# Patient Record
Sex: Female | Born: 1980 | Race: White | Hispanic: No | Marital: Single | Smoking: Former smoker
Health system: Southern US, Community
[De-identification: ages and names within clinical notes are randomized; demographics above are authoritative.]

## PROBLEM LIST (undated history)

## (undated) ENCOUNTER — Inpatient Hospital Stay (HOSPITAL_COMMUNITY): Payer: Medicaid Other

## (undated) DIAGNOSIS — Z789 Other specified health status: Secondary | ICD-10-CM

## (undated) DIAGNOSIS — E0500 Thyrotoxicosis with diffuse goiter without thyrotoxic crisis or storm: Secondary | ICD-10-CM

## (undated) DIAGNOSIS — E0590 Thyrotoxicosis, unspecified without thyrotoxic crisis or storm: Secondary | ICD-10-CM

## (undated) DIAGNOSIS — O139 Gestational [pregnancy-induced] hypertension without significant proteinuria, unspecified trimester: Secondary | ICD-10-CM

## (undated) HISTORY — DX: Gestational (pregnancy-induced) hypertension without significant proteinuria, unspecified trimester: O13.9

## (undated) HISTORY — DX: Thyrotoxicosis, unspecified without thyrotoxic crisis or storm: E05.90

## (undated) HISTORY — PX: OTHER SURGICAL HISTORY: SHX169

## (undated) HISTORY — DX: Other specified health status: Z78.9

## (undated) HISTORY — DX: Thyrotoxicosis with diffuse goiter without thyrotoxic crisis or storm: E05.00

## (undated) HISTORY — PX: NO PAST SURGERIES: SHX2092

---

## 2003-10-28 ENCOUNTER — Emergency Department (HOSPITAL_COMMUNITY): Admission: EM | Admit: 2003-10-28 | Discharge: 2003-10-28 | Payer: Self-pay | Admitting: Emergency Medicine

## 2003-11-07 ENCOUNTER — Emergency Department (HOSPITAL_COMMUNITY): Admission: EM | Admit: 2003-11-07 | Discharge: 2003-11-08 | Payer: Self-pay

## 2005-03-20 ENCOUNTER — Emergency Department (HOSPITAL_COMMUNITY): Admission: EM | Admit: 2005-03-20 | Discharge: 2005-03-21 | Payer: Self-pay | Admitting: Emergency Medicine

## 2016-05-02 ENCOUNTER — Encounter: Payer: Self-pay | Admitting: Obstetrics & Gynecology

## 2016-05-02 ENCOUNTER — Ambulatory Visit (INDEPENDENT_AMBULATORY_CARE_PROVIDER_SITE_OTHER): Payer: Self-pay | Admitting: *Deleted

## 2016-05-02 DIAGNOSIS — Z3201 Encounter for pregnancy test, result positive: Secondary | ICD-10-CM

## 2016-05-02 LAB — POCT PREGNANCY, URINE: Preg Test, Ur: POSITIVE — AB

## 2016-05-02 NOTE — Progress Notes (Signed)
Pt in for pregnancy test. She has irregular cycles and isn't sure of lmp. Ultrasound for viability and dating scheduled for 05/07/16 at 1100.

## 2016-05-07 ENCOUNTER — Encounter (HOSPITAL_COMMUNITY): Payer: Self-pay

## 2016-05-07 ENCOUNTER — Ambulatory Visit (HOSPITAL_COMMUNITY)
Admission: RE | Admit: 2016-05-07 | Discharge: 2016-05-07 | Disposition: A | Discharge status: Home / Self Care | Payer: Self-pay | Source: Ambulatory Visit | Attending: Family Medicine | Admitting: Family Medicine

## 2016-05-07 DIAGNOSIS — Z3201 Encounter for pregnancy test, result positive: Secondary | ICD-10-CM

## 2016-05-07 DIAGNOSIS — O3481 Maternal care for other abnormalities of pelvic organs, first trimester: Secondary | ICD-10-CM | POA: Insufficient documentation

## 2016-05-07 DIAGNOSIS — Z36 Encounter for antenatal screening of mother: Secondary | ICD-10-CM | POA: Insufficient documentation

## 2016-05-07 DIAGNOSIS — N83201 Unspecified ovarian cyst, right side: Secondary | ICD-10-CM | POA: Insufficient documentation

## 2016-05-07 DIAGNOSIS — Z3A01 Less than 8 weeks gestation of pregnancy: Secondary | ICD-10-CM | POA: Insufficient documentation

## 2016-05-08 ENCOUNTER — Encounter: Payer: Self-pay | Admitting: Family Medicine

## 2016-05-09 ENCOUNTER — Telehealth: Payer: Self-pay | Admitting: *Deleted

## 2016-05-09 NOTE — Telephone Encounter (Signed)
Pt left message yesterday requesting test results.  

## 2016-06-05 ENCOUNTER — Ambulatory Visit (HOSPITAL_COMMUNITY)
Admission: RE | Admit: 2016-06-05 | Discharge: 2016-06-05 | Disposition: A | Discharge status: Home / Self Care | Payer: Self-pay | Source: Ambulatory Visit | Attending: Obstetrics and Gynecology | Admitting: Obstetrics and Gynecology

## 2016-06-05 ENCOUNTER — Encounter: Payer: Self-pay | Admitting: Obstetrics and Gynecology

## 2016-06-05 ENCOUNTER — Ambulatory Visit (INDEPENDENT_AMBULATORY_CARE_PROVIDER_SITE_OTHER): Payer: Self-pay | Admitting: Obstetrics and Gynecology

## 2016-06-05 VITALS — BP 124/87 | HR 107 | Ht 62.0 in | Wt 142.1 lb

## 2016-06-05 DIAGNOSIS — Z315 Encounter for genetic counseling: Secondary | ICD-10-CM | POA: Insufficient documentation

## 2016-06-05 DIAGNOSIS — Z113 Encounter for screening for infections with a predominantly sexual mode of transmission: Secondary | ICD-10-CM

## 2016-06-05 DIAGNOSIS — O09511 Supervision of elderly primigravida, first trimester: Secondary | ICD-10-CM | POA: Insufficient documentation

## 2016-06-05 DIAGNOSIS — O09519 Supervision of elderly primigravida, unspecified trimester: Secondary | ICD-10-CM

## 2016-06-05 DIAGNOSIS — Z3491 Encounter for supervision of normal pregnancy, unspecified, first trimester: Secondary | ICD-10-CM

## 2016-06-05 DIAGNOSIS — Z3481 Encounter for supervision of other normal pregnancy, first trimester: Secondary | ICD-10-CM

## 2016-06-05 DIAGNOSIS — Z3A11 11 weeks gestation of pregnancy: Secondary | ICD-10-CM | POA: Insufficient documentation

## 2016-06-05 LAB — OB RESULTS CONSOLE GC/CHLAMYDIA: Gonorrhea: NEGATIVE

## 2016-06-05 LAB — TSH: TSH: 1.52 mIU/L

## 2016-06-05 LAB — T4, FREE: Free T4: 1 ng/dL (ref 0.8–1.8)

## 2016-06-05 NOTE — Addendum Note (Signed)
Addended by: Sherre LainASH, Viola Placeres A on: 06/05/2016 11:43 AM   Modules accepted: Orders

## 2016-06-05 NOTE — Progress Notes (Signed)
New OB Note  06/05/2016   [facility]  Chief Complaint: NOB  Transfer of Care Patient: no  History of Present Illness: [name] is a 35 y.o. G1 @ 11/4 weeks (EDC 1/28, based on Patient's last menstrual period was 03/16/2016.=7wk u/s), with the above CC. Preg complicated by has AMA (advanced maternal age) primigravida 35+ and Supervision of normal pregnancy in first trimester on her problem list.   Her periods were: irregular periods She was using no method when she conceived.  She has mild signs or symptoms of nausea/vomiting of pregnancy. She has Negative signs or symptoms of miscarriage or preterm labor She identifies Negative Zika risk factors for her and her partner  ROS: A 12-point review of systems was performed and negative, except as stated in the above HPI.  OBGYN History: As per HPI. OB History  Gravida Para Term Preterm AB SAB TAB Ectopic Multiple Living  1             # Outcome Date GA Lbr Len/2nd Weight Sex Delivery Anes PTL Lv  1 Current               Any prior children are healthy, doing well, without any problems or issues: not applicable History of abnormal pap smears: No. Last pap smear 2016 per patient History of STIs: No   Past Medical History: No past medical history on file.  Past Surgical History: History reviewed. No pertinent past surgical history.  Family History:  History reviewed. No pertinent family history. She denies any female cancers, bleeding or blood clotting disorders.  She denies any history of mental retardation, birth defects or genetic disorders in her or the FOB's history  Social History:  Social History   Social History  . Marital Status: Widowed    Spouse Name: N/A  . Number of Children: N/A  . Years of Education: N/A   Occupational History  . Not on file.   Social History Main Topics  . Smoking status: Former Smoker    Types: Cigarettes  . Smokeless tobacco: Former NeurosurgeonUser  .  Alcohol Use: Not on file  . Drug Use: No  . Sexual Activity: Yes   Other Topics Concern  . Not on file   Social History Narrative  Works in Designer, industrial/productadministrative position  Allergy: No Known Allergies  Health Maintenance:  Mammogram Up to Date: not applicable  Current Outpatient Medications: PNV  Physical Exam: BP 124/87 mmHg  Pulse 107  Ht 5\' 2"  (1.575 m)  Wt 142 lb 1.6 oz (64.456 kg)  BMI 25.98 kg/m2  LMP 03/16/2016 Body mass index is 25.98 kg/(m^2). Fundal height: not applicable FHTs: 160s  General appearance: Well nourished, well developed female in no acute distress.  Neck:  Supple, normal appearance, and no thyromegaly  Cardiovascular: S1, S2 normal, no murmur, rub or gallop, regular rate and rhythm Respiratory:  Clear to auscultation bilateral. Normal respiratory effort Abdomen: positive bowel sounds and no masses, hernias; diffusely non tender to palpation, non distended Breasts: breasts appear normal, no suspicious masses, no skin or nipple changes or axillary nodes, and normal palpation. Neuro/Psych:  Normal mood and affect.  Skin:  Warm and dry.  Lymphatic:  No inguinal lymphadenopathy.   Pelvic exam: is not limited by body habitus EGBUS: within normal limits, Vagina: within normal limits and with no blood in the vault, Cervix: normal appearing cervix without discharge or lesions, closed/long/high, Uterus:  enlarged, c/w 10 week size, and Adnexa:  normal adnexa and no mass, fullness, tenderness  Laboratory: none  Imaging:  As above. SLIUP in MAU   Assessment: Normal pregnancy, AMA. Pt doing well   Plan: 1. AMA (advanced maternal age) primigravida 35+, first trimester Routine care. GC options d/w her and she is leaning towards cffdna. Able to get her an appt at 0900 today with GC. Baseline pre-x labs and TFTs today too. Patient ate breakfast so told to come back for lab only early 1hr GCT given AMA - Prenatal Profile - Culture, OB Urine - GC/Chlamydia probe  amp ([place])not at [facility] - Hemoglobinopathy Evaluation - Pain Mgmt, Profile 6 Conf w/o mM, U - Cystic fibrosis diagnostic study - Protein / Creatinine Ratio, Urine - Comprehensive metabolic panel - TSH - T4, free - AMB MFM GENETICS REFERRAL - Glucose Tolerance, 1 HR (50g) w/o Fasting; Future  2. Supervision of normal pregnancy in first trimester As above.   Problem list reviewed and updated.  Follow up in 4 weeks.  >50% of 20 min visit spent on counseling and coordination of care.     Cornelia Copa MD Attending [facility])

## 2016-06-05 NOTE — Progress Notes (Signed)
Genetic Counseling  High-Risk Gestation Note  Appointment Date:  06/05/2016 Referred By: Menard Bing, MD Date of Birth:  08-26-1981   Pregnancy History: G1P0 Estimated Date of Delivery: 12/21/16 Estimated Gestational Age: [redacted]w[redacted]d Attending: Particia Nearing, MD  [name] was seen for genetic counseling because of a maternal age of 35 y.o..     In summary:  Discussed AMA and associated risk for fetal aneuploidy  Discussed options for screening  First screen-declined  Quad screen-declined  NIPS-elected to pursue today (Panorama through [place] [facility])  Ultrasound- NT ultrasound scheduled for 06/18/16 and detailed ultrasound scheduled for 07/30/16  Discussed diagnostic testing options  CVS-declined  Amniocentesis-declined  Reviewed family history concerns  Discussed carrier screening options  CF-drawn today at primary OB visit  SMA-declined today  Hemoglobinopathies-drawn today at primary OB visit  She was counseled regarding maternal age and the association with risk for chromosome conditions due to nondisjunction with aging of the ova.   We reviewed chromosomes, nondisjunction, and the associated 1 in  29 risk for fetal aneuploidy at [redacted]w[redacted]d related to a maternal age of 35 years old at delivery.  She was counseled that the risk for aneuploidy decreases as gestational age increases, accounting for those pregnancies which spontaneously abort.  We specifically discussed Down syndrome (trisomy 56), trisomies 65 and 44, and sex chromosome aneuploidies (47,XXX and 47,XXY) including the common features and prognoses of each.   We reviewed available screening options including First Screen, Quad screen, noninvasive prenatal screening (NIPS)/cell free DNA (cfDNA) screening, and detailed ultrasound.  She was counseled that screening tests are used to modify a patient's a priori risk for aneuploidy, typically based on age. This estimate provides a pregnancy specific risk  assessment. We reviewed the benefits and limitations of each option. Specifically, we discussed the conditions for which each test screens, the detection rates, and false positive rates of each. She was also counseled regarding diagnostic testing via CVS and amniocentesis. We reviewed the approximate 1 in 300-500 risk for complications from amniocentesis, including spontaneous pregnancy loss. We discussed the possible results that the tests might provide including: positive, negative, unanticipated, and no result. Finally, they were counseled regarding the cost of each option and potential out of pocket expenses. After consideration of all the options, she elected to proceed with NIPS (Panorama through [facility]).  Those results will be available in 8-10 days.  She declined maternal serum screening, CVS, and amniocentesis.   She also expressed interest in pursuing a nuchal translucency ultrasound, which was scheduled for 06/18/16.  The patient would like to return for a detailed ultrasound at ~18+ weeks gestation.  This appointment was scheduled for 07/30/16. She understands that screening tests cannot rule out all birth defects or genetic syndromes. The patient was advised of this limitation and states she still does not want additional testing at this time.    [name]  was provided with written information regarding cystic fibrosis (CF), spinal muscular atrophy (SMA) and hemoglobinopathies including the carrier frequency, availability of carrier screening and prenatal diagnosis if indicated.  In addition, we discussed that CF and hemoglobinopathies are routinely screened for as part of the [place] newborn screening panel. CF carrier screening and hemoglobin electrophoresis were drawn today at her primary OB visit, and results are pending. After further discussion, she declined screening for SMA.  Both family histories were reviewed and found to be noncontributory for birth defects, intellectual  disability, and known genetic conditions. The patient reported that her mother had a  stillbirth, a daughter, but no known cause was reported. Without further information regarding the provided family history, an accurate genetic risk cannot be calculated. Further genetic counseling is warranted if more information is obtained.  [name] denied exposure to environmental toxins or chemical agents. She denied the use of alcohol, tobacco or street drugs. She denied significant viral illnesses during the course of her pregnancy. Her medical and surgical histories were noncontributory.   I counseled Mrs. Penny Duffy regarding the above risks and available options.  The approximate face-to-face time with the genetic counselor was 45 minutes.  Penny PlowmanKaren Bentley Haralson, MS,  Certified Genetic Counselor 06/05/2016

## 2016-06-06 LAB — PRENATAL PROFILE (SOLSTAS)
Antibody Screen: NEGATIVE
Basophils Absolute: 0 cells/uL (ref 0–200)
Basophils Relative: 0 %
Eosinophils Absolute: 108 cells/uL (ref 15–500)
Eosinophils Relative: 1 %
HCT: 35.4 % (ref 35.0–45.0)
HIV 1&2 Ab, 4th Generation: NONREACTIVE
Hemoglobin: 12 g/dL (ref 11.7–15.5)
Hepatitis B Surface Ag: NEGATIVE
Lymphocytes Relative: 13 %
Lymphs Abs: 1404 cells/uL (ref 850–3900)
MCH: 29.7 pg (ref 27.0–33.0)
MCHC: 33.9 g/dL (ref 32.0–36.0)
MCV: 87.6 fL (ref 80.0–100.0)
MPV: 11.4 fL (ref 7.5–12.5)
Monocytes Absolute: 756 cells/uL (ref 200–950)
Monocytes Relative: 7 %
Neutro Abs: 8532 cells/uL — ABNORMAL HIGH (ref 1500–7800)
Neutrophils Relative %: 79 %
Platelets: 259 10*3/uL (ref 140–400)
RBC: 4.04 MIL/uL (ref 3.80–5.10)
RDW: 13.6 % (ref 11.0–15.0)
Rh Type: POSITIVE
Rubella: 1.43 Index — ABNORMAL HIGH (ref <0.90)
WBC: 10.8 10*3/uL (ref 3.8–10.8)

## 2016-06-06 LAB — COMPREHENSIVE METABOLIC PANEL
ALT: 10 U/L (ref 6–29)
AST: 12 U/L (ref 10–30)
Albumin: 3.8 g/dL (ref 3.6–5.1)
Alkaline Phosphatase: 59 U/L (ref 33–115)
BUN: 8 mg/dL (ref 7–25)
CO2: 21 mmol/L (ref 20–31)
Calcium: 9.1 mg/dL (ref 8.6–10.2)
Chloride: 104 mmol/L (ref 98–110)
Creat: 0.54 mg/dL (ref 0.50–1.10)
Glucose, Bld: 77 mg/dL (ref 65–99)
Potassium: 4.4 mmol/L (ref 3.5–5.3)
Sodium: 136 mmol/L (ref 135–146)
Total Bilirubin: 0.2 mg/dL (ref 0.2–1.2)
Total Protein: 6.1 g/dL (ref 6.1–8.1)

## 2016-06-06 LAB — GC/CHLAMYDIA PROBE AMP (~~LOC~~) NOT AT ARMC
Chlamydia: NEGATIVE
Neisseria Gonorrhea: NEGATIVE

## 2016-06-06 NOTE — Addendum Note (Signed)
Addended by: Garret ReddishBARNES, Kiersten Coss M on: 06/06/2016 12:32 PM   Modules accepted: Orders

## 2016-06-09 LAB — HEMOGLOBINOPATHY EVALUATION
HCT: 35.4 % (ref 35.0–45.0)
Hemoglobin: 12 g/dL (ref 11.7–15.5)
Hgb A2 Quant: 2.5 % (ref 1.8–3.5)
Hgb A: 96.5 % (ref >96.0)
Hgb F Quant: <1 % (ref <2.0)
MCH: 29.7 pg (ref 27.0–33.0)
MCV: 87.6 fL (ref 80.0–100.0)
RBC: 4.04 MIL/uL (ref 3.80–5.10)
RDW: 13.6 % (ref 11.0–15.0)

## 2016-06-10 LAB — CYSTIC FIBROSIS DIAGNOSTIC STUDY

## 2016-06-11 ENCOUNTER — Other Ambulatory Visit: Payer: Self-pay

## 2016-06-12 ENCOUNTER — Telehealth (HOSPITAL_COMMUNITY): Payer: Self-pay | Admitting: MS"

## 2016-06-12 NOTE — Telephone Encounter (Signed)
Patient called to check on status of NIPS. Discussed that the lab is still processing and expected to result in next 1-2 days. Will contact patient once available.   Penny BraunKaren Syon Duffy 06/12/2016 3:31 PM

## 2016-06-13 ENCOUNTER — Telehealth (HOSPITAL_COMMUNITY): Payer: Self-pay | Admitting: MS"

## 2016-06-13 NOTE — Telephone Encounter (Signed)
Called Penny Duffy to discuss her prenatal cell free DNA test results.  Ms. Luisa Dagoracy Urizar had Panorama testing through [facility].  Testing was offered because of advanced maternal age.   The patient was identified by name and DOB.  We reviewed that these are within normal limits, showing a less than 1 in 10,000 risk for trisomies 21, 18 and 13.  In addition, the risk for triploidy and sex chromosome trisomies (47,XXX and 47,XXY) was also low risk.  We reviewed that this testing identifies > 99% of pregnancies with trisomy 4321, trisomy 2613, sex chromosome trisomies (47,XXX and 47,XXY), and triploidy. The detection rate for trisomy 18 is 96%.  The false positive rate is <0.1% for all conditions.  We discussed that analysis was not able to be performed for monosomy X (Turner syndrome). If the patient desires analysis for this condition, a second sample could be submitted. Reviewed that this condition is not included in the aneuploidy conditions associated with maternal age. Patient did not not indicate that she would desire a redraw at this time. Testing was also consistent with female fetal sex.  The patient did wish to know fetal sex.  She understands that this testing does not identify all genetic conditions.  All questions were answered to her satisfaction, she was encouraged to call with additional questions or concerns.  Quinn PlowmanKaren Krishawn Vanderweele, MS Certified Genetic Counselor 06/13/2016 5:04 PM

## 2016-06-16 ENCOUNTER — Other Ambulatory Visit (HOSPITAL_COMMUNITY): Payer: Self-pay

## 2016-06-18 ENCOUNTER — Encounter (HOSPITAL_COMMUNITY): Payer: Self-pay

## 2016-06-18 ENCOUNTER — Ambulatory Visit (HOSPITAL_COMMUNITY)
Admission: RE | Admit: 2016-06-18 | Discharge: 2016-06-18 | Disposition: A | Discharge status: Home / Self Care | Payer: Self-pay | Source: Ambulatory Visit | Attending: Obstetrics and Gynecology | Admitting: Obstetrics and Gynecology

## 2016-06-18 DIAGNOSIS — O09519 Supervision of elderly primigravida, unspecified trimester: Secondary | ICD-10-CM

## 2016-06-18 DIAGNOSIS — Z3A13 13 weeks gestation of pregnancy: Secondary | ICD-10-CM | POA: Insufficient documentation

## 2016-06-18 DIAGNOSIS — O09512 Supervision of elderly primigravida, second trimester: Secondary | ICD-10-CM | POA: Insufficient documentation

## 2016-06-18 DIAGNOSIS — Z36 Encounter for antenatal screening of mother: Secondary | ICD-10-CM | POA: Insufficient documentation

## 2016-07-02 ENCOUNTER — Ambulatory Visit (INDEPENDENT_AMBULATORY_CARE_PROVIDER_SITE_OTHER): Payer: Self-pay | Admitting: Obstetrics and Gynecology

## 2016-07-02 VITALS — BP 127/82 | HR 89 | Wt 148.4 lb

## 2016-07-02 DIAGNOSIS — Z3491 Encounter for supervision of normal pregnancy, unspecified, first trimester: Secondary | ICD-10-CM

## 2016-07-02 DIAGNOSIS — O09512 Supervision of elderly primigravida, second trimester: Secondary | ICD-10-CM

## 2016-07-02 DIAGNOSIS — Z3400 Encounter for supervision of normal first pregnancy, unspecified trimester: Secondary | ICD-10-CM | POA: Insufficient documentation

## 2016-07-02 LAB — POCT URINALYSIS DIP (DEVICE)
Bilirubin Urine: NEGATIVE
Glucose, UA: NEGATIVE mg/dL
Ketones, ur: NEGATIVE mg/dL
Leukocytes, UA: NEGATIVE
Nitrite: NEGATIVE
Protein, ur: NEGATIVE mg/dL
Specific Gravity, Urine: 1.02 (ref 1.005–1.030)
Urobilinogen, UA: 0.2 mg/dL (ref 0.0–1.0)
pH: 7.5 (ref 5.0–8.0)

## 2016-07-02 NOTE — Progress Notes (Signed)
Subjective:  Penny Duffy is a 35 y.o. G1P0 at 5178w3d being seen today for ongoing prenatal care.  She is currently monitored for the following issues for this low-risk pregnancy and has AMA (advanced maternal age) primigravida 35+ and Supervision of normal pregnancy in first trimester on her problem list.  Patient reports no complaints.  Contractions: Not present. Vag. Bleeding: None.  Movement: Present. Denies leaking of fluid. Patient was unable to complete 1 hour GTT at her last visit due to "I ate breakfast". She would like to reschedule that today.   The following portions of the patient's history were reviewed and updated as appropriate: allergies, current medications, past family history, past medical history, past social history, past surgical history and problem list. Problem list updated.  Objective:   Vitals:   07/02/16 0822  BP: 127/82  Pulse: 89  Weight: 148 lb 6.4 oz (67.3 kg)    Fetal Status: Fetal Heart Rate (bpm): 150   Movement: Present     General:  Alert, oriented and cooperative. Patient is in no acute distress.  Skin: Skin is warm and dry. No rash noted.   Cardiovascular: Normal heart rate noted  Respiratory: Normal respiratory effort, no problems with respiration noted  Abdomen: Soft, gravid, appropriate for gestational age. Pain/Pressure: Absent     Pelvic:  Cervical exam deferred        Extremities: Normal range of motion.  Edema: None  Mental Status: Normal mood and affect. Normal behavior. Normal judgment and thought content.   Urinalysis:      Results for orders placed or performed in visit on 07/02/16 (from the past 48 hour(s))  POCT urinalysis dip (device)     Status: Abnormal   Collection Time: 07/02/16  9:20 AM  Result Value Ref Range   Glucose, UA NEGATIVE NEGATIVE mg/dL   Bilirubin Urine NEGATIVE NEGATIVE   Ketones, ur NEGATIVE NEGATIVE mg/dL   Specific Gravity, Urine 1.020 1.005 - 1.030   Hgb urine dipstick TRACE (A) NEGATIVE   pH 7.5 5.0 -  8.0   Protein, ur NEGATIVE NEGATIVE mg/dL   Urobilinogen, UA 0.2 0.0 - 1.0 mg/dL   Nitrite NEGATIVE NEGATIVE   Leukocytes, UA NEGATIVE NEGATIVE    Comment: Biochemical Testing Only. Please order routine urinalysis from main lab if confirmatory testing is needed.    Assessment and Plan:  Pregnancy: G1P0 at 7578w3d  1. Supervision of normal first pregnancy, antepartum  - Urine Culture - Pain Mgmt, Profile 6 Conf w/o mM,  - Sept 6th she has her anatomy scan scheduled; keep this appointment  - NIPS testing done  - Prenatal vitamins daily.   2. AMA (advanced maternal age) primigravida 35+, second trimester  - Needs first trimester 1 hour GTT   Reviewed preterm labor precautions including reasons to go to MAU.  Return in about 4 weeks (around 07/30/2016).   Duane LopeJennifer I Neesha Langton, NP

## 2016-07-03 LAB — URINE CULTURE: Organism ID, Bacteria: 10000

## 2016-07-08 ENCOUNTER — Telehealth: Payer: Self-pay | Admitting: *Deleted

## 2016-07-08 LAB — PAIN MGMT, PROFILE 6 CONF W/O MM, U
6 Acetylmorphine: NEGATIVE ng/mL (ref <10)
Alcohol Metabolites: NEGATIVE ng/mL (ref <500)
Amphetamines: NEGATIVE ng/mL (ref <500)
Barbiturates: NEGATIVE ng/mL (ref <300)
Benzodiazepines: NEGATIVE ng/mL (ref <100)
Cocaine Metabolite: NEGATIVE ng/mL (ref <150)
Creatinine: 116.5 mg/dL (ref >=20.0)
Marijuana Metabolite: 19 ng/mL — ABNORMAL HIGH (ref <5)
Marijuana Metabolite: POSITIVE ng/mL — AB (ref <20)
Methadone Metabolite: NEGATIVE ng/mL (ref <100)
Opiates: NEGATIVE ng/mL (ref <100)
Oxidant: NEGATIVE ug/mL (ref <200)
Oxycodone: NEGATIVE ng/mL (ref <100)
Phencyclidine: NEGATIVE ng/mL (ref <25)
Please note:: 0
pH: 7.3 (ref 4.5–9.0)

## 2016-07-08 NOTE — Telephone Encounter (Signed)
Penny Duffy called yesterday afternoon and left a message that she would like to get results.

## 2016-07-09 NOTE — Telephone Encounter (Signed)
I called Penny Duffy and she states she actually got a message that results on MY Chart so she is fine.

## 2016-07-15 ENCOUNTER — Other Ambulatory Visit: Payer: Self-pay

## 2016-07-15 DIAGNOSIS — Z3492 Encounter for supervision of normal pregnancy, unspecified, second trimester: Secondary | ICD-10-CM

## 2016-07-15 LAB — GLUCOSE TOLERANCE, 1 HOUR (50G) W/O FASTING: Glucose, 1 Hr, gestational: 103 mg/dL (ref <140)

## 2016-07-30 ENCOUNTER — Other Ambulatory Visit (HOSPITAL_COMMUNITY): Payer: Self-pay | Admitting: *Deleted

## 2016-07-30 ENCOUNTER — Encounter: Payer: Self-pay | Admitting: Student

## 2016-07-30 ENCOUNTER — Ambulatory Visit (HOSPITAL_COMMUNITY)
Admission: RE | Admit: 2016-07-30 | Discharge: 2016-07-30 | Disposition: A | Discharge status: Home / Self Care | Payer: Medicaid Other | Source: Ambulatory Visit | Attending: Obstetrics and Gynecology | Admitting: Obstetrics and Gynecology

## 2016-07-30 ENCOUNTER — Encounter (HOSPITAL_COMMUNITY): Payer: Self-pay

## 2016-07-30 DIAGNOSIS — Z36 Encounter for antenatal screening of mother: Secondary | ICD-10-CM | POA: Insufficient documentation

## 2016-07-30 DIAGNOSIS — O09519 Supervision of elderly primigravida, unspecified trimester: Secondary | ICD-10-CM

## 2016-07-30 DIAGNOSIS — Z3A19 19 weeks gestation of pregnancy: Secondary | ICD-10-CM | POA: Diagnosis not present

## 2016-07-30 DIAGNOSIS — O09512 Supervision of elderly primigravida, second trimester: Secondary | ICD-10-CM | POA: Diagnosis not present

## 2016-08-13 ENCOUNTER — Encounter: Payer: Self-pay | Admitting: Advanced Practice Midwife

## 2016-08-13 ENCOUNTER — Ambulatory Visit (INDEPENDENT_AMBULATORY_CARE_PROVIDER_SITE_OTHER): Payer: Self-pay | Admitting: Advanced Practice Midwife

## 2016-08-13 DIAGNOSIS — O09512 Supervision of elderly primigravida, second trimester: Secondary | ICD-10-CM

## 2016-08-13 DIAGNOSIS — Z3491 Encounter for supervision of normal pregnancy, unspecified, first trimester: Secondary | ICD-10-CM

## 2016-08-13 NOTE — Patient Instructions (Signed)

## 2016-08-13 NOTE — Progress Notes (Signed)
   PRENATAL VISIT NOTE  Subjective:  Penny Duffy is a 35 y.o. G1P0 at 5129w3d being seen today for ongoing prenatal care.  She is currently monitored for the following issues for this high-risk pregnancy and has AMA (advanced maternal age) primigravida 35+ and Supervision of normal pregnancy in first trimester on her problem list.  Patient reports no complaints.  Contractions: Not present. Vag. Bleeding: None.  Movement: Present. Denies leaking of fluid.   The following portions of the patient's history were reviewed and updated as appropriate: allergies, current medications, past family history, past medical history, past social history, past surgical history and problem list. Problem list updated.  Objective:   Vitals:   08/13/16 1543  BP: 137/79  Pulse: 98  Weight: 162 lb 9.6 oz (73.8 kg)    Fetal Status: Fetal Heart Rate (bpm): 152   Movement: Present     General:  Alert, oriented and cooperative. Patient is in no acute distress.  Skin: Skin is warm and dry. No rash noted.   Cardiovascular: Normal heart rate noted  Respiratory: Normal respiratory effort, no problems with respiration noted  Abdomen: Soft, gravid, appropriate for gestational age. Pain/Pressure: Absent     Pelvic:  Cervical exam deferred        Extremities: Normal range of motion.  Edema: None  Mental Status: Normal mood and affect. Normal behavior. Normal judgment and thought content.   Urinalysis:      Assessment and Plan:  Pregnancy: G1P0 at 2829w3d  1. Supervision of normal pregnancy in first trimester     Discussed normal progression of fetal movement, back pain suggestions, warning signs  2. AMA (advanced maternal age) primigravida 7135+, second trimester      US normal.  Followup growth US already scheduled for October and November  Preterm labor symptoms and general obstetric precautions including but not limited to vaginal bleeding, contractions, leaking of fluid and fetal movement were reviewed in  detail with the patient. Please refer to After Visit Summary for other counseling recommendations.   Return in about 4 weeks (around 09/10/2016) for Low Risk Clinic.  Aviva SignsMarie L Mickayla Trouten, CNM

## 2016-09-10 ENCOUNTER — Encounter (HOSPITAL_COMMUNITY): Payer: Self-pay

## 2016-09-10 ENCOUNTER — Ambulatory Visit (HOSPITAL_COMMUNITY)
Admission: RE | Admit: 2016-09-10 | Discharge: 2016-09-10 | Disposition: A | Discharge status: Home / Self Care | Payer: Medicaid Other | Source: Ambulatory Visit | Attending: Obstetrics and Gynecology | Admitting: Obstetrics and Gynecology

## 2016-09-10 DIAGNOSIS — O09519 Supervision of elderly primigravida, unspecified trimester: Secondary | ICD-10-CM

## 2016-09-10 DIAGNOSIS — Z3A25 25 weeks gestation of pregnancy: Secondary | ICD-10-CM | POA: Insufficient documentation

## 2016-09-10 DIAGNOSIS — O09512 Supervision of elderly primigravida, second trimester: Secondary | ICD-10-CM | POA: Diagnosis not present

## 2016-09-22 ENCOUNTER — Ambulatory Visit (INDEPENDENT_AMBULATORY_CARE_PROVIDER_SITE_OTHER): Payer: Medicaid Other | Admitting: Student

## 2016-09-22 ENCOUNTER — Encounter: Payer: Self-pay | Admitting: Family Medicine

## 2016-09-22 VITALS — BP 132/83 | HR 89 | Wt 170.0 lb

## 2016-09-22 DIAGNOSIS — O09512 Supervision of elderly primigravida, second trimester: Secondary | ICD-10-CM

## 2016-09-22 DIAGNOSIS — Z3401 Encounter for supervision of normal first pregnancy, first trimester: Secondary | ICD-10-CM

## 2016-09-22 LAB — CBC
HCT: 37.6 % (ref 35.0–45.0)
Hemoglobin: 12.7 g/dL (ref 11.7–15.5)
MCH: 30 pg (ref 27.0–33.0)
MCHC: 33.8 g/dL (ref 32.0–36.0)
MCV: 88.9 fL (ref 80.0–100.0)
MPV: 11 fL (ref 7.5–12.5)
Platelets: 197 10*3/uL (ref 140–400)
RBC: 4.23 MIL/uL (ref 3.80–5.10)
RDW: 13.6 % (ref 11.0–15.0)
WBC: 12.8 10*3/uL — ABNORMAL HIGH (ref 3.8–10.8)

## 2016-09-22 NOTE — Patient Instructions (Signed)

## 2016-09-22 NOTE — Progress Notes (Signed)
   PRENATAL VISIT NOTE  Subjective:  Penny Duffy is a 35 y.o. G1P0 at 223w1d being seen today for ongoing prenatal care.  She is currently monitored for the following issues for this high-risk pregnancy and has AMA (advanced maternal age) primigravida 35+ and Supervision of normal pregnancy in first trimester on her problem list.  Patient reports no complaints.  Contractions: Not present. Vag. Bleeding: None.  Movement: Present. Denies leaking of fluid.   The following portions of the patient's history were reviewed and updated as appropriate: allergies, current medications, past family history, past medical history, past social history, past surgical history and problem list. Problem list updated.  Objective:   Vitals:   09/22/16 0809  BP: 132/83  Pulse: 89  Weight: 170 lb (77.1 kg)    Fetal Status: Fetal Heart Rate (bpm): 147   Movement: Present   Fundal height 25 cm  General:  Alert, oriented and cooperative. Patient is in no acute distress.  Skin: Skin is warm and dry. No rash noted.   Cardiovascular: Normal heart rate noted  Respiratory: Normal respiratory effort, no problems with respiration noted  Abdomen: Soft, gravid, appropriate for gestational age. Pain/Pressure: Absent     Pelvic:  Cervical exam deferred        Extremities: Normal range of motion.  Edema: Trace  Mental Status: Normal mood and affect. Normal behavior. Normal judgment and thought content.   Assessment and Plan:  Pregnancy: G1P0 at 3223w1d  1. Encounter for supervision of normal first pregnancy  - CBC - RPR - HIV antibody (with reflex) - Glucose Tolerance, 1 HR (50g) w/o Fasting  2. Elderly primigravida in second trimester  - CBC - RPR - HIV antibody (with reflex) - Glucose Tolerance, 1 HR (50g) w/o Fasting  Preterm labor symptoms and general obstetric precautions including but not limited to vaginal bleeding, contractions, leaking of fluid and fetal movement were reviewed in detail with the  patient. Please refer to After Visit Summary for other counseling recommendations.  Return in about 2 weeks (around 10/06/2016) for Routine OB.  Judeth HornErin Tyson Masin, NP

## 2016-09-23 LAB — GLUCOSE TOLERANCE, 1 HOUR (50G) W/O FASTING: Glucose, 1 Hr, gestational: 161 mg/dL — ABNORMAL HIGH (ref <140)

## 2016-09-23 LAB — HIV ANTIBODY (ROUTINE TESTING W REFLEX): HIV 1&2 Ab, 4th Generation: NONREACTIVE

## 2016-09-23 LAB — RPR

## 2016-09-25 ENCOUNTER — Encounter: Payer: Medicaid Other | Admitting: Family Medicine

## 2016-09-30 ENCOUNTER — Other Ambulatory Visit: Payer: Self-pay

## 2016-09-30 DIAGNOSIS — O24419 Gestational diabetes mellitus in pregnancy, unspecified control: Secondary | ICD-10-CM

## 2016-10-01 ENCOUNTER — Telehealth: Payer: Self-pay

## 2016-10-01 LAB — GLUCOSE TOLERANCE, 3 HOURS
Glucose Tolerance, 1 hour: 167 mg/dL (ref <190)
Glucose Tolerance, 2 hour: 207 mg/dL — ABNORMAL HIGH (ref <165)
Glucose Tolerance, Fasting: 82 mg/dL (ref 65–104)
Glucose, GTT - 3 Hour: 125 mg/dL (ref <145)

## 2016-10-01 NOTE — Telephone Encounter (Signed)
Per Penny HornErin Duffy, pt needs 3 hr.  Contacted pt about the need for f/u 3 hr glucose.  Pt informed me that she came in already and had the 3 hr glucose lab on 09/30/16.  Looking at pt's chart, per standing protocol, I informed pt that only one value resulted abnormal but she does not have gestational diabetes.  Pt stated thank you and had no further questions.

## 2016-10-06 ENCOUNTER — Encounter: Payer: Self-pay | Admitting: Student

## 2016-10-07 ENCOUNTER — Ambulatory Visit (INDEPENDENT_AMBULATORY_CARE_PROVIDER_SITE_OTHER): Payer: Self-pay | Admitting: Advanced Practice Midwife

## 2016-10-07 ENCOUNTER — Encounter: Payer: Self-pay | Admitting: Family Medicine

## 2016-10-07 VITALS — BP 130/80 | HR 84 | Wt 174.8 lb

## 2016-10-07 DIAGNOSIS — O09512 Supervision of elderly primigravida, second trimester: Secondary | ICD-10-CM

## 2016-10-07 DIAGNOSIS — Z3401 Encounter for supervision of normal first pregnancy, first trimester: Secondary | ICD-10-CM

## 2016-10-07 NOTE — Patient Instructions (Signed)
Third Trimester of Pregnancy The third trimester is from week 29 through week 40 (months 7 through 9). The third trimester is a time when the unborn baby (fetus) is growing rapidly. At the end of the ninth month, the fetus is about 20 inches in length and weighs 6-10 pounds. Body changes during your third trimester Your body goes through many changes during pregnancy. The changes vary from woman to woman. During the third trimester:  Your weight will continue to increase. You can expect to gain 25-35 pounds (11-16 kg) by the end of the pregnancy.  You may begin to get stretch marks on your hips, abdomen, and breasts.  You may urinate more often because the fetus is moving lower into your pelvis and pressing on your bladder.  You may develop or continue to have heartburn. This is caused by increased hormones that slow down muscles in the digestive tract.  You may develop or continue to have constipation because increased hormones slow digestion and cause the muscles that push waste through your intestines to relax.  You may develop hemorrhoids. These are swollen veins (varicose veins) in the rectum that can itch or be painful.  You may develop swollen, bulging veins (varicose veins) in your legs.  You may have increased body aches in the pelvis, back, or thighs. This is due to weight gain and increased hormones that are relaxing your joints.  You may have changes in your hair. These can include thickening of your hair, rapid growth, and changes in texture. Some women also have hair loss during or after pregnancy, or hair that feels dry or thin. Your hair will most likely return to normal after your baby is born.  Your breasts will continue to grow and they will continue to become tender. A yellow fluid (colostrum) may leak from your breasts. This is the first milk you are producing for your baby.  Your belly button may stick out.  You may notice more swelling in your hands, face, or  ankles.  You may have increased tingling or numbness in your hands, arms, and legs. The skin on your belly may also feel numb.  You may feel short of breath because of your expanding uterus.  You may have more problems sleeping. This can be caused by the size of your belly, increased need to urinate, and an increase in your body's metabolism.  You may notice the fetus "dropping," or moving lower in your abdomen.  You may have increased vaginal discharge.  Your cervix becomes thin and soft (effaced) near your due date. What to expect at prenatal visits You will have prenatal exams every 2 weeks until week 36. Then you will have weekly prenatal exams. During a routine prenatal visit:  You will be weighed to make sure you and the fetus are growing normally.  Your blood pressure will be taken.  Your abdomen will be measured to track your baby's growth.  The fetal heartbeat will be listened to.  Any test results from the previous visit will be discussed.  You may have a cervical check near your due date to see if you have effaced. At around 36 weeks, your health care provider will check your cervix. At the same time, your health care provider will also perform a test on the secretions of the vaginal tissue. This test is to determine if a type of bacteria, Group B streptococcus, is present. Your health care provider will explain this further. Your health care provider may ask you:    What your birth plan is.  How you are feeling.  If you are feeling the baby move.  If you have had any abnormal symptoms, such as leaking fluid, bleeding, severe headaches, or abdominal cramping.  If you are using any tobacco products, including cigarettes, chewing tobacco, and electronic cigarettes.  If you have any questions. Other tests or screenings that may be performed during your third trimester include:  Blood tests that check for low iron levels (anemia).  Fetal testing to check the health,  activity level, and growth of the fetus. Testing is done if you have certain medical conditions or if there are problems during the pregnancy.  Nonstress test (NST). This test checks the health of your baby to make sure there are no signs of problems, such as the baby not getting enough oxygen. During this test, a belt is placed around your belly. The baby is made to move, and its heart rate is monitored during movement. What is false labor? False labor is a condition in which you feel small, irregular tightenings of the muscles in the womb (contractions) that eventually go away. These are called Braxton Hicks contractions. Contractions may last for hours, days, or even weeks before true labor sets in. If contractions come at regular intervals, become more frequent, increase in intensity, or become painful, you should see your health care provider. What are the signs of labor?  Abdominal cramps.  Regular contractions that start at 10 minutes apart and become stronger and more frequent with time.  Contractions that start on the top of the uterus and spread down to the lower abdomen and back.  Increased pelvic pressure and dull back pain.  A watery or bloody mucus discharge that comes from the vagina.  Leaking of amniotic fluid. This is also known as your "water breaking." It could be a slow trickle or a gush. Let your doctor know if it has a color or strange odor. If you have any of these signs, call your health care provider right away, even if it is before your due date. Follow these instructions at home: Eating and drinking  Continue to eat regular, healthy meals.  Do not eat:  Raw meat or meat spreads.  Unpasteurized milk or cheese.  Unpasteurized juice.  Store-made salad.  Refrigerated smoked seafood.  Hot dogs or deli meat, unless they are piping hot.  More than 6 ounces of albacore tuna a week.  Shark, swordfish, king mackerel, or tile fish.  Store-made salads.  Raw  sprouts, such as mung bean or alfalfa sprouts.  Take prenatal vitamins as told by your health care provider.  Take 1000 mg of calcium daily as told by your health care provider.  If you develop constipation:  Take over-the-counter or prescription medicines.  Drink enough fluid to keep your urine clear or pale yellow.  Eat foods that are high in fiber, such as fresh fruits and vegetables, whole grains, and beans.  Limit foods that are high in fat and processed sugars, such as fried and sweet foods. Activity  Exercise only as directed by your health care provider. Healthy pregnant women should aim for 2 hours and 30 minutes of moderate exercise per week. If you experience any pain or discomfort while exercising, stop.  Avoid heavy lifting.  Do not exercise in extreme heat or humidity, or at high altitudes.  Wear low-heel, comfortable shoes.  Practice good posture.  Do not travel far distances unless it is absolutely necessary and only with the approval   of your health care provider.  Wear your seat belt at all times while in a car, on a bus, or on a plane.  Take frequent breaks and rest with your legs elevated if you have leg cramps or low back pain.  Do not use hot tubs, steam rooms, or saunas.  You may continue to have sex unless your health care provider tells you otherwise. Lifestyle  Do not use any products that contain nicotine or tobacco, such as cigarettes and e-cigarettes. If you need help quitting, ask your health care provider.  Do not drink alcohol.  Do not use any medicinal herbs or unprescribed drugs. These chemicals affect the formation and growth of the baby.  If you develop varicose veins:  Wear support pantyhose or compression stockings as told by your healthcare provider.  Elevate your feet for 15 minutes, 3-4 times a day.  Wear a supportive maternity bra to help with breast tenderness. General instructions  Take over-the-counter and prescription  medicines only as told by your health care provider. There are medicines that are either safe or unsafe to take during pregnancy.  Take warm sitz baths to soothe any pain or discomfort caused by hemorrhoids. Use hemorrhoid cream or witch hazel if your health care provider approves.  Avoid cat litter boxes and soil used by cats. These carry germs that can cause birth defects in the baby. If you have a cat, ask someone to clean the litter box for you.  To prepare for the arrival of your baby:  Take prenatal classes to understand, practice, and ask questions about the labor and delivery.  Make a trial run to the hospital.  Visit the hospital and tour the maternity area.  Arrange for maternity or paternity leave through employers.  Arrange for family and friends to take care of pets while you are in the hospital.  Purchase a rear-facing car seat and make sure you know how to install it in your car.  Pack your hospital bag.  Prepare the baby's nursery. Make sure to remove all pillows and stuffed animals from the baby's crib to prevent suffocation.  Visit your dentist if you have not gone during your pregnancy. Use a soft toothbrush to brush your teeth and be gentle when you floss.  Keep all prenatal follow-up visits as told by your health care provider. This is important. Contact a health care provider if:  You are unsure if you are in labor or if your water has broken.  You become dizzy.  You have mild pelvic cramps, pelvic pressure, or nagging pain in your abdominal area.  You have lower back pain.  You have persistent nausea, vomiting, or diarrhea.  You have an unusual or bad smelling vaginal discharge.  You have pain when you urinate. Get help right away if:  You have a fever.  You are leaking fluid from your vagina.  You have spotting or bleeding from your vagina.  You have severe abdominal pain or cramping.  You have rapid weight loss or weight gain.  You have  shortness of breath with chest pain.  You notice sudden or extreme swelling of your face, hands, ankles, feet, or legs.  Your baby makes fewer than 10 movements in 2 hours.  You have severe headaches that do not go away with medicine.  You have vision changes. Summary  The third trimester is from week 29 through week 40, months 7 through 9. The third trimester is a time when the unborn baby (fetus)   is growing rapidly.  During the third trimester, your discomfort may increase as you and your baby continue to gain weight. You may have abdominal, leg, and back pain, sleeping problems, and an increased need to urinate.  During the third trimester your breasts will keep growing and they will continue to become tender. A yellow fluid (colostrum) may leak from your breasts. This is the first milk you are producing for your baby.  False labor is a condition in which you feel small, irregular tightenings of the muscles in the womb (contractions) that eventually go away. These are called Braxton Hicks contractions. Contractions may last for hours, days, or even weeks before true labor sets in.  Signs of labor can include: abdominal cramps; regular contractions that start at 10 minutes apart and become stronger and more frequent with time; watery or bloody mucus discharge that comes from the vagina; increased pelvic pressure and dull back pain; and leaking of amniotic fluid. This information is not intended to replace advice given to you by your health care provider. Make sure you discuss any questions you have with your health care provider. Document Released: 11/04/2001 Document Revised: 04/17/2016 Document Reviewed: 01/11/2013 Elsevier Interactive Patient Education  2017 [employer]  

## 2016-10-09 ENCOUNTER — Telehealth: Payer: Self-pay

## 2016-10-09 NOTE — Telephone Encounter (Signed)
Contacted pt to verify if she wanted her FMLA paperwork to be filled out for intermittent or for when she delivers.  Pt stated that she wanted the paperwork to be filled out for when she delivers.  I explained to the patient that it will be filled out from her due date to approximately 6-8 weeks depending on what type of delivery and that the paperwork can be changed.  Pt stated understanding with no further questions.

## 2016-10-09 NOTE — Progress Notes (Signed)
   PRENATAL VISIT NOTE  Subjective:  Penny Duffy is a 35 y.o. G1P0 at 2954w4d being seen today for ongoing prenatal care.  She is currently monitored for the following issues for this low-risk pregnancy and has AMA (advanced maternal age) primigravida 35+ and Supervision of normal pregnancy in first trimester on her problem list.  Patient reports no complaints.  Contractions: Not present. Vag. Bleeding: None.  Movement: Present. Denies leaking of fluid.   The following portions of the patient's history were reviewed and updated as appropriate: allergies, current medications, past family history, past medical history, past social history, past surgical history and problem list. Problem list updated.  Objective:   Vitals:   10/07/16 0823  BP: 130/80  Pulse: 84  Weight: 174 lb 12.8 oz (79.3 kg)    Fetal Status: Fetal Heart Rate (bpm): 150 Fundal Height: 29 cm Movement: Present     General:  Alert, oriented and cooperative. Patient is in no acute distress.  Skin: Skin is warm and dry. No rash noted.   Cardiovascular: Normal heart rate noted  Respiratory: Normal respiratory effort, no problems with respiration noted  Abdomen: Soft, gravid, appropriate for gestational age. Pain/Pressure: Absent     Pelvic:  Cervical exam deferred        Extremities: Normal range of motion.  Edema: None  Mental Status: Normal mood and affect. Normal behavior. Normal judgment and thought content.   Assessment and Plan:  Pregnancy: G1P0 at 1354w4d  1. Encounter for supervision of normal first pregnancy   2. AMA (advanced maternal age) primigravida 6735+, second trimester   Preterm labor symptoms and general obstetric precautions including but not limited to vaginal bleeding, contractions, leaking of fluid and fetal movement were reviewed in detail with the patient. Please refer to After Visit Summary for other counseling recommendations.  Return in about 2 weeks (around 10/21/2016).   Hurshel PartyLisa A  Leftwich-Kirby, CNM

## 2016-10-21 ENCOUNTER — Ambulatory Visit (INDEPENDENT_AMBULATORY_CARE_PROVIDER_SITE_OTHER): Payer: Medicaid Other | Admitting: Student

## 2016-10-21 VITALS — BP 130/80 | HR 84 | Wt 176.9 lb

## 2016-10-21 DIAGNOSIS — Z3403 Encounter for supervision of normal first pregnancy, third trimester: Secondary | ICD-10-CM

## 2016-10-21 LAB — POCT URINALYSIS DIP (DEVICE)
Bilirubin Urine: NEGATIVE
Glucose, UA: 500 mg/dL — AB
Ketones, ur: NEGATIVE mg/dL
Leukocytes, UA: NEGATIVE
Nitrite: NEGATIVE
Protein, ur: NEGATIVE mg/dL
Specific Gravity, Urine: 1.02 (ref 1.005–1.030)
Urobilinogen, UA: 0.2 mg/dL (ref 0.0–1.0)
pH: 7 (ref 5.0–8.0)

## 2016-10-21 NOTE — Patient Instructions (Signed)
How a Baby Grows During Pregnancy Introduction Pregnancy begins when a female's sperm enters a female's egg (fertilization). This happens in one of the tubes (fallopian tubes) that connect the ovaries to the womb (uterus). The fertilized egg is called an embryo until it reaches 10 weeks. From 10 weeks until birth, it is called a fetus. The fertilized egg moves down the fallopian tube to the uterus. Then it implants into the lining of the uterus and begins to grow. The developing fetus receives oxygen and nutrients through the pregnant woman's bloodstream and the tissues that grow (placenta) to support the fetus. The placenta is the life support system for the fetus. It provides nutrition and removes waste. Learning as much as you can about your pregnancy and how your baby is developing can help you enjoy the experience. It can also make you aware of when there might be a problem and when to ask questions. How long does a typical pregnancy last? A pregnancy usually lasts 280 days, or about 40 weeks. Pregnancy is divided into three trimesters:  First trimester: 0-13 weeks.  Second trimester: 14-27 weeks.  Third trimester: 28-40 weeks. The day when your baby is considered ready to be born (full term) is your estimated date of delivery. How does my baby develop month by month? First month  The fertilized egg attaches to the inside of the uterus.  Some cells will form the placenta. Others will form the fetus.  The arms, legs, brain, spinal cord, lungs, and heart begin to develop.  At the end of the first month, the heart begins to beat. Second month  The bones, inner ear, eyelids, hands, and feet form.  The genitals develop.  By the end of 8 weeks, all major organs are developing. Third month  All of the internal organs are forming.  Teeth develop below the gums.  Bones and muscles begin to grow. The spine can flex.  The skin is transparent.  Fingernails and toenails begin to  form.  Arms and legs continue to grow longer, and hands and feet develop.  The fetus is about 3 in (7.6 cm) long. Fourth month  The placenta is completely formed.  The external sex organs, neck, outer ear, eyebrows, eyelids, and fingernails are formed.  The fetus can hear, swallow, and move its arms and legs.  The kidneys begin to produce urine.  The skin is covered with a white waxy coating (vernix) and very fine hair (lanugo). Fifth month  The fetus moves around more and can be felt for the first time (quickening).  The fetus starts to sleep and wake up and may begin to suck its finger.  The nails grow to the end of the fingers.  The organ in the digestive system that makes bile (gallbladder) functions and helps to digest the nutrients.  If your baby is a girl, eggs are present in her ovaries. If your baby is a boy, testicles start to move down into his scrotum. Sixth month  The lungs are formed, but the fetus is not yet able to breathe.  The eyes open. The brain continues to develop.  Your baby has fingerprints and toe prints. Your baby's hair grows thicker.  At the end of the second trimester, the fetus is about 9 in (22.9 cm) long. Seventh month  The fetus kicks and stretches.  The eyes are developed enough to sense changes in light.  The hands can make a grasping motion.  The fetus responds to sound. Eighth month    All organs and body systems are fully developed and functioning.  Bones harden and taste buds develop. The fetus may hiccup.  Certain areas of the brain are still developing. The skull remains soft. Ninth month  The fetus gains about  lb (0.23 kg) each week.  The lungs are fully developed.  Patterns of sleep develop.  The fetus's head typically moves into a head-down position (vertex) in the uterus to prepare for birth. If the buttocks move into a vertex position instead, the baby is breech.  The fetus weighs 6-9 lbs (2.72-4.08 kg) and is  19-20 in (48.26-50.8 cm) long. What can I do to have a healthy pregnancy and help my baby develop?  Eating and Drinking  Eat a healthy diet.  Talk with your health care provider to make sure that you are getting the nutrients that you and your baby need.  Visit www.choosemyplate.gov to learn about creating a healthy diet.  Gain a healthy amount of weight during pregnancy as advised by your health care provider. This is usually 25-35 pounds. You may need to:  Gain more if you were underweight before getting pregnant or if you are pregnant with more than one baby.  Gain less if you were overweight or obese when you got pregnant. Medicines and Vitamins  Take prenatal vitamins as directed by your health care provider. These include vitamins such as folic acid, iron, calcium, and vitamin D. They are important for healthy development.  Take medicines only as directed by your health care provider. Read labels and ask a pharmacist or your health care provider whether over-the-counter medicines, supplements, and prescription drugs are safe to take during pregnancy. Activities  Be physically active as advised by your health care provider. Ask your health care provider to recommend activities that are safe for you to do, such as walking or swimming.  Do not participate in strenuous or extreme sports.  Lifestyle  Do not drink alcohol.  Do not use any tobacco products, including cigarettes, chewing tobacco, or electronic cigarettes. If you need help quitting, ask your health care provider.  Do not use illegal drugs. Safety  Avoid exposure to mercury, lead, or other heavy metals. Ask your health care provider about common sources of these heavy metals.  Avoid listeria infection during pregnancy. Follow these precautions:  Do not eat soft cheeses or deli meats.  Do not eat hot dogs unless they have been warmed up to the point of steaming, such as in the microwave oven.  Do not drink  unpasteurized milk.  Avoid toxoplasmosis infection during pregnancy. Follow these precautions:  Do not change your cat's litter box, if you have a cat. Ask someone else to do this for you.  Wear gardening gloves while working in the yard. General Instructions  Keep all follow-up visits as directed by your health care provider. This is important. This includes prenatal care and screening tests.  Manage any chronic health conditions. Work closely with your health care provider to keep conditions, such as diabetes, under control. How do I know if my baby is developing well? At each prenatal visit, your health care provider will do several different tests to check on your health and keep track of your baby's development. These include:  Fundal height.  Your health care provider will measure your growing belly from top to bottom using a tape measure.  Your health care provider will also feel your belly to determine your baby's position.  Heartbeat.  An ultrasound in the first trimester   can confirm pregnancy and show a heartbeat, depending on how far along you are.  Your health care provider will check your baby's heart rate at every prenatal visit.  As you get closer to your delivery date, you may have regular fetal heart rate monitoring to make sure that your baby is not in distress.  Second trimester ultrasound.  This ultrasound checks your baby's development. It also indicates your baby's gender. What should I do if I have concerns about my baby's development? Always talk with your health care provider about any concerns that you may have. This information is not intended to replace advice given to you by your health care provider. Make sure you discuss any questions you have with your health care provider. Document Released: 04/28/2008 Document Revised: 04/17/2016 Document Reviewed: 04/19/2014  2017 Elsevier  

## 2016-10-21 NOTE — Progress Notes (Signed)
ob

## 2016-10-21 NOTE — Progress Notes (Signed)
   PRENATAL VISIT NOTE  Subjective:  Penny Duffy is a 35 y.o. G1P0 at 5271w2d being seen today for ongoing prenatal care.  She is currently monitored for the following issues for this low-risk pregnancy and has AMA (advanced maternal age) primigravida 35+ and Supervision of normal pregnancy in first trimester on her problem list.  Patient reports no complaints.  Contractions: Not present. Vag. Bleeding: None.  Movement: Present. Denies leaking of fluid.   The following portions of the patient's history were reviewed and updated as appropriate: allergies, current medications, past family history, past medical history, past social history, past surgical history and problem list. Problem list updated.  Objective:   Vitals:   10/21/16 0802  BP: 130/80  Pulse: 84  Weight: 80.2 kg (176 lb 14.4 oz)    Fetal Status: Fetal Heart Rate (bpm): 148 Fundal Height: 31 cm Movement: Present     General:  Alert, oriented and cooperative. Patient is in no acute distress.  Skin: Skin is warm and dry. No rash noted.   Cardiovascular: Normal heart rate noted  Respiratory: Normal respiratory effort, no problems with respiration noted  Abdomen: Soft, gravid, appropriate for gestational age. Pain/Pressure: Absent     Pelvic:  Cervical exam deferred        Extremities: Normal range of motion.  Edema: Trace  Mental Status: Normal mood and affect. Normal behavior. Normal judgment and thought content.   Assessment and Plan:  Pregnancy: G1P0 at 671w2d  There are no diagnoses linked to this encounter.  Patient Penny Duffy doing well; feeling baby move.  UDS done today. Patient declines Flu shot today but will get next visit.    Preterm labor symptoms and general obstetric precautions including but not limited to vaginal bleeding, contractions, leaking of fluid and fetal movement were reviewed in detail with the patient. Please refer to After Visit Summary for other counseling recommendations.  Return  in about 2 weeks (around 11/04/2016).   Marylene LandKathryn Lorraine Kooistra, CNM

## 2016-10-22 ENCOUNTER — Encounter (HOSPITAL_COMMUNITY): Payer: Self-pay

## 2016-10-22 ENCOUNTER — Ambulatory Visit (HOSPITAL_COMMUNITY)
Admission: RE | Admit: 2016-10-22 | Discharge: 2016-10-22 | Disposition: A | Discharge status: Home / Self Care | Payer: Medicaid Other | Source: Ambulatory Visit | Attending: Obstetrics and Gynecology | Admitting: Obstetrics and Gynecology

## 2016-10-22 DIAGNOSIS — O09513 Supervision of elderly primigravida, third trimester: Secondary | ICD-10-CM | POA: Diagnosis present

## 2016-10-22 DIAGNOSIS — O09519 Supervision of elderly primigravida, unspecified trimester: Secondary | ICD-10-CM

## 2016-10-22 DIAGNOSIS — Z3A31 31 weeks gestation of pregnancy: Secondary | ICD-10-CM | POA: Insufficient documentation

## 2016-10-22 LAB — PAIN MGMT, PROFILE 6 CONF W/O MM, U
6 Acetylmorphine: NEGATIVE ng/mL (ref <10)
Alcohol Metabolites: NEGATIVE ng/mL (ref <500)
Amphetamines: NEGATIVE ng/mL (ref <500)
Barbiturates: NEGATIVE ng/mL (ref <300)
Benzodiazepines: NEGATIVE ng/mL (ref <100)
Cocaine Metabolite: NEGATIVE ng/mL (ref <150)
Creatinine: 78.1 mg/dL (ref >=20.0)
Marijuana Metabolite: NEGATIVE ng/mL (ref <20)
Methadone Metabolite: NEGATIVE ng/mL (ref <100)
Opiates: NEGATIVE ng/mL (ref <100)
Oxidant: NEGATIVE ug/mL (ref <200)
Oxycodone: NEGATIVE ng/mL (ref <100)
Phencyclidine: NEGATIVE ng/mL (ref <25)
Please note:: 0
pH: 7.14 (ref 4.5–9.0)

## 2016-11-05 ENCOUNTER — Ambulatory Visit (INDEPENDENT_AMBULATORY_CARE_PROVIDER_SITE_OTHER): Payer: Self-pay | Admitting: Medical

## 2016-11-05 VITALS — BP 126/85 | HR 108 | Wt 182.6 lb

## 2016-11-05 DIAGNOSIS — O163 Unspecified maternal hypertension, third trimester: Secondary | ICD-10-CM

## 2016-11-05 DIAGNOSIS — O133 Gestational [pregnancy-induced] hypertension without significant proteinuria, third trimester: Secondary | ICD-10-CM

## 2016-11-05 LAB — POCT URINALYSIS DIP (DEVICE)
BILIRUBIN URINE: NEGATIVE
GLUCOSE, UA: NEGATIVE mg/dL
Ketones, ur: NEGATIVE mg/dL
LEUKOCYTES UA: NEGATIVE
NITRITE: NEGATIVE
Protein, ur: NEGATIVE mg/dL
Specific Gravity, Urine: 1.02 (ref 1.005–1.030)
UROBILINOGEN UA: 0.2 mg/dL (ref 0.0–1.0)
pH: 7 (ref 5.0–8.0)

## 2016-11-05 LAB — COMPREHENSIVE METABOLIC PANEL
ALBUMIN: 3.2 g/dL — AB (ref 3.6–5.1)
ALK PHOS: 120 U/L — AB (ref 33–115)
ALT: 26 U/L (ref 6–29)
AST: 21 U/L (ref 10–30)
BILIRUBIN TOTAL: 0.3 mg/dL (ref 0.2–1.2)
BUN: 8 mg/dL (ref 7–25)
CALCIUM: 8.7 mg/dL (ref 8.6–10.2)
CO2: 20 mmol/L (ref 20–31)
CREATININE: 0.5 mg/dL (ref 0.50–1.10)
Chloride: 105 mmol/L (ref 98–110)
Glucose, Bld: 121 mg/dL — ABNORMAL HIGH (ref 65–99)
Potassium: 3.8 mmol/L (ref 3.5–5.3)
Sodium: 136 mmol/L (ref 135–146)
TOTAL PROTEIN: 5.9 g/dL — AB (ref 6.1–8.1)

## 2016-11-05 LAB — CBC
HEMATOCRIT: 39.3 % (ref 35.0–45.0)
HEMOGLOBIN: 13.3 g/dL (ref 11.7–15.5)
MCH: 29.9 pg (ref 27.0–33.0)
MCHC: 33.8 g/dL (ref 32.0–36.0)
MCV: 88.3 fL (ref 80.0–100.0)
MPV: 11.5 fL (ref 7.5–12.5)
Platelets: 166 10*3/uL (ref 140–400)
RBC: 4.45 MIL/uL (ref 3.80–5.10)
RDW: 14.5 % (ref 11.0–15.0)
WBC: 12.5 10*3/uL — AB (ref 3.8–10.8)

## 2016-11-05 NOTE — Patient Instructions (Signed)
Introduction Patient Name: ________________________________________________ Patient Due Date: ____________________ What is a fetal movement count? A fetal movement count is the number of times that you feel your baby move during a certain amount of time. This may also be called a fetal kick count. A fetal movement count is recommended for every pregnant woman. You may be asked to start counting fetal movements as early as week 28 of your pregnancy. Pay attention to when your baby is most active. You may notice your baby's sleep and wake cycles. You may also notice things that make your baby move more. You should do a fetal movement count:  When your baby is normally most active.  At the same time each day. A good time to count movements is while you are resting, after having something to eat and drink. How do I count fetal movements? 1. Find a quiet, comfortable area. Sit, or lie down on your side. 2. Write down the date, the start time and stop time, and the number of movements that you felt between those two times. Take this information with you to your health care visits. 3. For 2 hours, count kicks, flutters, swishes, rolls, and jabs. You should feel at least 10 movements during 2 hours. 4. You may stop counting after you have felt 10 movements. 5. If you do not feel 10 movements in 2 hours, have something to eat and drink. Then, keep resting and counting for 1 hour. If you feel at least 4 movements during that hour, you may stop counting. Contact a health care provider if:  You feel fewer than 4 movements in 2 hours.  Your baby is not moving like he or she usually does. Date: ____________ Start time: ____________ Stop time: ____________ Movements: ____________ Date: ____________ Start time: ____________ Stop time: ____________ Movements: ____________ Date: ____________ Start time: ____________ Stop time: ____________ Movements: ____________ Date: ____________ Start time: ____________  Stop time: ____________ Movements: ____________ Date: ____________ Start time: ____________ Stop time: ____________ Movements: ____________ Date: ____________ Start time: ____________ Stop time: ____________ Movements: ____________ Date: ____________ Start time: ____________ Stop time: ____________ Movements: ____________ Date: ____________ Start time: ____________ Stop time: ____________ Movements: ____________ Date: ____________ Start time: ____________ Stop time: ____________ Movements: ____________ This information is not intended to replace advice given to you by your health care provider. Make sure you discuss any questions you have with your health care provider. Document Released: 12/10/2006 Document Revised: 07/09/2016 Document Reviewed: 12/20/2015 Elsevier Interactive Patient Education  2017 Elsevier Inc. Braxton Hicks Contractions Contractions of the uterus can occur throughout pregnancy. Contractions are not always a sign that you are in labor.  WHAT ARE BRAXTON HICKS CONTRACTIONS?  Contractions that occur before labor are called Braxton Hicks contractions, or false labor. Toward the end of pregnancy (32-34 weeks), these contractions can develop more often and may become more forceful. This is not true labor because these contractions do not result in opening (dilatation) and thinning of the cervix. They are sometimes difficult to tell apart from true labor because these contractions can be forceful and people have different pain tolerances. You should not feel embarrassed if you go to the hospital with false labor. Sometimes, the only way to tell if you are in true labor is for your health care provider to look for changes in the cervix. If there are no prenatal problems or other health problems associated with the pregnancy, it is completely safe to be sent home with false labor and await the onset of true labor.   HOW CAN YOU TELL THE DIFFERENCE BETWEEN TRUE AND FALSE LABOR? False Labor     The contractions of false labor are usually shorter and not as hard as those of true labor.   The contractions are usually irregular.   The contractions are often felt in the front of the lower abdomen and in the groin.   The contractions may go away when you walk around or change positions while lying down.   The contractions get weaker and are shorter lasting as time goes on.   The contractions do not usually become progressively stronger, regular, and closer together as with true labor.  True Labor   Contractions in true labor last 30-70 seconds, become very regular, usually become more intense, and increase in frequency.   The contractions do not go away with walking.   The discomfort is usually felt in the top of the uterus and spreads to the lower abdomen and low back.   True labor can be determined by your health care provider with an exam. This will show that the cervix is dilating and getting thinner.  WHAT TO REMEMBER  Keep up with your usual exercises and follow other instructions given by your health care provider.   Take medicines as directed by your health care provider.   Keep your regular prenatal appointments.   Eat and drink lightly if you think you are going into labor.   If Braxton Hicks contractions are making you uncomfortable:   Change your position from lying down or resting to walking, or from walking to resting.   Sit and rest in a tub of warm water.   Drink 2-3 glasses of water. Dehydration may cause these contractions.   Do slow and deep breathing several times an hour.  WHEN SHOULD I SEEK IMMEDIATE MEDICAL CARE? Seek immediate medical care if:  Your contractions become stronger, more regular, and closer together.   You have fluid leaking or gushing from your vagina.   You have a fever.   You pass blood-tinged mucus.   You have vaginal bleeding.   You have continuous abdominal pain.   You have low back pain  that you never had before.   You feel your baby's head pushing down and causing pelvic pressure.   Your baby is not moving as much as it used to.  This information is not intended to replace advice given to you by your health care provider. Make sure you discuss any questions you have with your health care provider. Document Released: 11/10/2005 Document Revised: 03/03/2016 Document Reviewed: 08/22/2013 Elsevier Interactive Patient Education  2017 Elsevier Inc.  

## 2016-11-05 NOTE — Progress Notes (Signed)
    PRENATAL VISIT NOTE  Subjective:  Penny Duffy is a 35 y.o. G1P0 at 63w3dbeing seen today for ongoing prenatal care.  She is currently monitored for the following issues for this low-risk pregnancy and has AMA (advanced maternal age) primigravida 35+ and Supervision of low-risk first pregnancy, third trimester on her problem list.  Patient reports peripheral edema, patient denies headache, blurred vision, floaters, RUQ abdominal pain today. She states that edema improves with rest.  Contractions: Not present. Vag. Bleeding: None.  Movement: Present. Denies leaking of fluid.   The following portions of the patient's history were reviewed and updated as appropriate: allergies, current medications, past family history, past medical history, past social history, past surgical history and problem list. Problem list updated.  Objective:   Vitals:   11/05/16 0809 11/05/16 0813  BP: (!) 134/93 126/85  Pulse: (!) 108 (!) 108  Weight: 182 lb 9.6 oz (82.8 kg)     Fetal Status: Fetal Heart Rate (bpm): 141 Fundal Height: 33 cm Movement: Present     General:  Alert, oriented and cooperative. Patient is in no acute distress.  Skin: Skin is warm and dry. No rash noted.   Cardiovascular: Normal heart rate noted  Respiratory: Normal respiratory effort, no problems with respiration noted  Abdomen: Soft, gravid, appropriate for gestational age. Pain/Pressure: Absent     Pelvic:  Cervical exam deferred        Extremities: Normal range of motion.  Edema: mild, non-pitting peripheral edema  Mental Status: Normal mood and affect. Normal behavior. Normal judgment and thought content.   Assessment and Plan:  Pregnancy: G1P0 at 339w3d1. Elevated blood pressure complicating pregnancy in third trimester, antepartum - CBC - Comp Met (CMET) - Protein / Creatinine Ratio, Urine - Urinalysis   Preterm labor symptoms and general obstetric precautions including but not limited to vaginal bleeding,  contractions, leaking of fluid and fetal movement were reviewed in detail with the patient. Warning signs for pre-eclampsia reviewed Please refer to After Visit Summary for other counseling recommendations.  Return in about 2 weeks (around 11/19/2016) for LOB.   JuLuvenia ReddenPA-C

## 2016-11-06 LAB — PROTEIN / CREATININE RATIO, URINE
CREATININE, URINE: 78 mg/dL (ref 20–320)
PROTEIN CREATININE RATIO: 231 mg/g{creat} — AB (ref 21–161)
TOTAL PROTEIN, URINE: 18 mg/dL (ref 5–24)

## 2016-11-21 ENCOUNTER — Ambulatory Visit (INDEPENDENT_AMBULATORY_CARE_PROVIDER_SITE_OTHER): Payer: Medicaid Other | Admitting: Family Medicine

## 2016-11-21 ENCOUNTER — Encounter: Payer: Self-pay | Admitting: Family Medicine

## 2016-11-21 ENCOUNTER — Other Ambulatory Visit (HOSPITAL_COMMUNITY)
Admission: RE | Admit: 2016-11-21 | Discharge: 2016-11-21 | Disposition: A | Discharge status: Home / Self Care | Payer: Medicaid Other | Source: Ambulatory Visit | Attending: Family Medicine | Admitting: Family Medicine

## 2016-11-21 VITALS — BP 146/108 | HR 85 | Wt 184.0 lb

## 2016-11-21 DIAGNOSIS — Z113 Encounter for screening for infections with a predominantly sexual mode of transmission: Secondary | ICD-10-CM | POA: Insufficient documentation

## 2016-11-21 DIAGNOSIS — O133 Gestational [pregnancy-induced] hypertension without significant proteinuria, third trimester: Secondary | ICD-10-CM | POA: Diagnosis not present

## 2016-11-21 DIAGNOSIS — O163 Unspecified maternal hypertension, third trimester: Secondary | ICD-10-CM

## 2016-11-21 DIAGNOSIS — O09513 Supervision of elderly primigravida, third trimester: Secondary | ICD-10-CM | POA: Diagnosis not present

## 2016-11-21 DIAGNOSIS — Z3403 Encounter for supervision of normal first pregnancy, third trimester: Secondary | ICD-10-CM

## 2016-11-21 LAB — CBC
HEMATOCRIT: 43.7 % (ref 35.0–45.0)
HEMOGLOBIN: 14.9 g/dL (ref 11.7–15.5)
MCH: 30.3 pg (ref 27.0–33.0)
MCHC: 34.1 g/dL (ref 32.0–36.0)
MCV: 89 fL (ref 80.0–100.0)
MPV: 12.1 fL (ref 7.5–12.5)
Platelets: 171 10*3/uL (ref 140–400)
RBC: 4.91 MIL/uL (ref 3.80–5.10)
RDW: 14.4 % (ref 11.0–15.0)
WBC: 13.5 10*3/uL — ABNORMAL HIGH (ref 3.8–10.8)

## 2016-11-21 LAB — COMPREHENSIVE METABOLIC PANEL
ALBUMIN: 3.5 g/dL — AB (ref 3.6–5.1)
ALK PHOS: 152 U/L — AB (ref 33–115)
ALT: 22 U/L (ref 6–29)
AST: 22 U/L (ref 10–30)
BILIRUBIN TOTAL: 0.3 mg/dL (ref 0.2–1.2)
BUN: 8 mg/dL (ref 7–25)
CALCIUM: 9.2 mg/dL (ref 8.6–10.2)
CO2: 21 mmol/L (ref 20–31)
Chloride: 105 mmol/L (ref 98–110)
Creat: 0.54 mg/dL (ref 0.50–1.10)
Glucose, Bld: 80 mg/dL (ref 65–99)
POTASSIUM: 4.3 mmol/L (ref 3.5–5.3)
Sodium: 137 mmol/L (ref 135–146)
TOTAL PROTEIN: 6.2 g/dL (ref 6.1–8.1)

## 2016-11-21 LAB — OB RESULTS CONSOLE GBS: STREP GROUP B AG: NEGATIVE

## 2016-11-21 NOTE — Progress Notes (Signed)
   PRENATAL VISIT NOTE  Subjective:  Penny Duffy is a 35 y.o. G1P0 at 7w5dbeing seen today for ongoing prenatal care.  She is currently monitored for the following issues for this high-risk pregnancy and has AMA (advanced maternal age) primigravida 35+ and Supervision of low-risk first pregnancy, third trimester on her problem list.  Patient reports headache - slight headache.  Contractions: Irritability. Vag. Bleeding: None.  Movement: Present. Denies leaking of fluid.   The following portions of the patient's history were reviewed and updated as appropriate: allergies, current medications, past family history, past medical history, past social history, past surgical history and problem list. Problem list updated.  Objective:   Vitals:   11/21/16 1137 11/21/16 1138 11/21/16 1152  BP: (!) 137/103 (!) 143/107 (!) 146/108  Pulse: 85    Weight: 184 lb (83.5 kg)      Fetal Status: Fetal Heart Rate (bpm): 152 Fundal Height: 36 cm Movement: Present     General:  Alert, oriented and cooperative. Patient is in no acute distress.  Skin: Skin is warm and dry. No rash noted.   Cardiovascular: Normal heart rate noted  Respiratory: Normal respiratory effort, no problems with respiration noted  Abdomen: Soft, gravid, appropriate for gestational age. Pain/Pressure: Present     Pelvic:  Cervical exam deferred        Extremities: Normal range of motion.  Edema: Mild pitting, slight indentation  Mental Status: Normal mood and affect. Normal behavior. Normal judgment and thought content.   Assessment and Plan:  Pregnancy: G1P0 at 375w5d1. Supervision of low-risk first pregnancy, third trimester FHT and FH normal.  - Culture, beta strep (group b only) - [facility]. Elderly primigravida in third trimester - Culture, beta strep (group b only) - [facility]. Elevated blood pressure complicating pregnancy in third  trimester, antepartum NST today. Will get labs to r/o preeclampsia. Delivery at 37 weeks. - CBC - Comp Met (CMET) - Protein / creatinine ratio, urine - Fetal nonstress test  Preterm labor symptoms and general obstetric precautions including but not limited to vaginal bleeding, contractions, leaking of fluid and fetal movement were reviewed in detail with the patient. Please refer to After Visit Summary for other counseling recommendations.  Return in about 5 days (around 11/26/2016) for OB f/u, NST.   JaTruett MainlandDO

## 2016-11-21 NOTE — Progress Notes (Signed)
Patient reports slight headaches for past two weeks- hasn't taken anything; denies blurry vision or dizziness

## 2016-11-22 LAB — PROTEIN / CREATININE RATIO, URINE
Creatinine, Urine: 120 mg/dL (ref 20–320)
Protein Creatinine Ratio: 167 mg/g creat — ABNORMAL HIGH (ref 21–161)
Total Protein, Urine: 20 mg/dL (ref 5–24)

## 2016-11-23 LAB — CULTURE, BETA STREP (GROUP B ONLY)

## 2016-11-24 NOTE — L&D Delivery Note (Signed)
36 y.o. G1P0 at 1737w2d delivered a viable female infant in cephalic, vertex LOA position. Anterior shoulder delivered with ease. 60 sec delayed cord clamping. Cord clamped x2 and cut. Placenta delivered spontaneously intact, with 3VC. Fundus firm on exam with massage and pitocin. Good hemostasis noted.  Laceration: 1st degree Suture: 3.0 Vicryl Good hemostasis noted. EBL 100cc  Mom and baby recovering in LDR.    Apgars: 9/9 Weight: pending    Renne Muscaaniel L Tariyah Pendry, MD PGY-1 12/02/2016, 9:18 AM

## 2016-11-25 ENCOUNTER — Ambulatory Visit (INDEPENDENT_AMBULATORY_CARE_PROVIDER_SITE_OTHER): Payer: Medicaid Other | Admitting: Advanced Practice Midwife

## 2016-11-25 ENCOUNTER — Ambulatory Visit (INDEPENDENT_AMBULATORY_CARE_PROVIDER_SITE_OTHER): Payer: Medicaid Other | Admitting: Clinical

## 2016-11-25 ENCOUNTER — Encounter: Payer: Self-pay | Admitting: *Deleted

## 2016-11-25 ENCOUNTER — Ambulatory Visit: Payer: Self-pay

## 2016-11-25 ENCOUNTER — Encounter: Payer: Medicaid Other | Admitting: Advanced Practice Midwife

## 2016-11-25 VITALS — BP 138/97 | HR 116 | Wt 187.0 lb

## 2016-11-25 DIAGNOSIS — Z3689 Encounter for other specified antenatal screening: Secondary | ICD-10-CM | POA: Diagnosis not present

## 2016-11-25 DIAGNOSIS — O133 Gestational [pregnancy-induced] hypertension without significant proteinuria, third trimester: Secondary | ICD-10-CM

## 2016-11-25 DIAGNOSIS — F4321 Adjustment disorder with depressed mood: Secondary | ICD-10-CM

## 2016-11-25 DIAGNOSIS — O163 Unspecified maternal hypertension, third trimester: Secondary | ICD-10-CM

## 2016-11-25 DIAGNOSIS — F4320 Adjustment disorder, unspecified: Secondary | ICD-10-CM | POA: Diagnosis not present

## 2016-11-25 DIAGNOSIS — O09513 Supervision of elderly primigravida, third trimester: Secondary | ICD-10-CM

## 2016-11-25 LAB — GC/CHLAMYDIA PROBE AMP (~~LOC~~) NOT AT ARMC
CHLAMYDIA, DNA PROBE: NEGATIVE
NEISSERIA GONORRHEA: NEGATIVE

## 2016-11-25 NOTE — BH Specialist Note (Signed)
Session Start time: 2:44 End Time: 3:04 Total Time:  20 minutes Type of Service: Behavioral Health - Individual/Family Interpreter: No.   Interpreter Name & Language: n/a # [facility]  SUBJECTIVE: Penny Duffy is a 36 y.o. female  Pt. was referred by Sharen CounterLisa Leftwich-Kirby, CNM for:  dog passed away. Pt. reports the following symptoms/concerns: Pt states that her primary concern today is that her baby may be negatively affected by her feelings of grief, as her 37yo chihuahua, ChiChi, passed away this morning; pt says she is feels better after finding out that baby is doing well, and that she will be induced on Sunday. Pt says she does not want to talk about her loss today, but is open to additional coping strategies to prepare for baby, including being able to sleep well this afternoon. Duration of problem:  Today Severity: mild Previous treatment: none  OBJECTIVE: Mood: Appropriate & Affect: Appropriate Risk of harm to self or others: No known risk of harm to self or others Assessments administered: PHQ9: 0/ GAD7: 0  LIFE CONTEXT:  Family & Social: Lives by herself, widowed, dog passed away this morning; her mother is her support person Self-care: Pt and her mother use humor to cope with life challenges  Life changes: Current pregnancy, loss of dog today What is important to pt/family (values): Healthy baby  GOALS ADDRESSED:  -Begin to process feelings of grief  INTERVENTIONS: Other: Grief support   ASSESSMENT:  Pt currently experiencing Grief.  Pt may benefit from brief therapeutic intervention regarding coping with grief.   PLAN: 1. F/U with behavioral health clinician: As needed 2. Behavioral Health meds: none 3. Behavioral recommendations:  -Try sound app today, to find a sound that helps with sleep/nap  -Take home Postpartum Planner today, and discuss with mother to come up with plan to best prepare for postpartum  -Consider comedy to watch with  mother today and/or tomorrow; every day this week -After baby is born, attend MomTalk and Breastfeeding Support classes at 9110 and 11am Tuesdays, [facility], within first month postpartum, for additional social support 4. Referral: Brief Counseling/Psychotherapy and [facility] 5. From scale of 1-10, how likely are you to follow plan: 8  Woc-Behavioral Health Clinician  Behavioral Health Clinician  Marlon PelWarmhandoff:   Warm Hand Off Completed.        Depression screen [facility] 2/9 11/25/2016 11/21/2016 10/21/2016 10/07/2016 09/22/2016  Decreased Interest 0 0 0 0 0  Down, Depressed, Hopeless 0 0 0 0 0  PHQ - 2 Score 0 0 0 0 0  Altered sleeping 0 0 0 0 0  Tired, decreased energy 0 0 1 1 3   Change in appetite 0 0 0 0 0  Feeling bad or failure about yourself  0 0 0 0 0  Trouble concentrating 0 0 0 0 0  Moving slowly or fidgety/restless 0 0 0 0 0  Suicidal thoughts 0 0 0 0 0  PHQ-9 Score 0 0 1 1 3    GAD 7 : Generalized Anxiety Score 11/25/2016 11/21/2016 10/21/2016 10/07/2016  Nervous, Anxious, on Edge 0 0 0 0  Control/stop worrying 0 0 0 0  Worry too much - different things 0 0 0 0  Trouble relaxing 0 0 1 1  Restless 0 0 0 0  Easily annoyed or irritable 0 0 0 0  Afraid - awful might happen 0 0 0 0  Total GAD 7 Score 0 0 1 1

## 2016-11-25 NOTE — Progress Notes (Signed)
Pt informed that the ultrasound is considered a limited OB ultrasound and is not intended to be a complete ultrasound exam.  Patient also informed that the ultrasound is not being completed with the intent of assessing for fetal or placental anomalies or any pelvic abnormalities.  Explained that the purpose of today's ultrasound is to assess for presentation and amniotic fluid volume.  Patient acknowledges the purpose of the exam and the limitations of the study.    IOL scheduled 1/7 @ 0800

## 2016-11-25 NOTE — Addendum Note (Signed)
Addended by: Hulda MarinMCMANNES, Raelea Gosse C on: 11/25/2016 04:52 PM   Modules accepted: Orders

## 2016-11-27 ENCOUNTER — Telehealth (HOSPITAL_COMMUNITY): Payer: Self-pay | Admitting: *Deleted

## 2016-11-27 ENCOUNTER — Encounter (HOSPITAL_COMMUNITY): Payer: Self-pay | Admitting: *Deleted

## 2016-11-27 DIAGNOSIS — O139 Gestational [pregnancy-induced] hypertension without significant proteinuria, unspecified trimester: Secondary | ICD-10-CM | POA: Insufficient documentation

## 2016-11-27 NOTE — Telephone Encounter (Signed)
Preadmission screen  

## 2016-11-27 NOTE — Progress Notes (Signed)
   PRENATAL VISIT NOTE  Subjective:  Penny Duffy is a 36 y.o. G1P0 at 7068w4d being seen today for ongoing prenatal care.  She is currently monitored for the following issues for this high-risk pregnancy and has AMA (advanced maternal age) primigravida 35+; Supervision of low-risk first pregnancy, third trimester; and Gestational hypertension on her problem list.  Patient reports no complaints.  Contractions: Irregular. Vag. Bleeding: None.  Movement: Present. Denies leaking of fluid.   The following portions of the patient's history were reviewed and updated as appropriate: allergies, current medications, past family history, past medical history, past social history, past surgical history and problem list. Problem list updated.  Objective:   Vitals:   11/25/16 1418  BP: (!) 138/97  Pulse: (!) 116  Weight: 187 lb (84.8 kg)    Fetal Status: Fetal Heart Rate (bpm): NST   Movement: Present  Presentation: Vertex  General:  Alert, oriented and cooperative. Patient is in no acute distress.  Skin: Skin is warm and dry. No rash noted.   Cardiovascular: Normal heart rate noted  Respiratory: Normal respiratory effort, no problems with respiration noted  Abdomen: Soft, gravid, appropriate for gestational age. Pain/Pressure: Present     Pelvic:  Cervical exam deferred        Extremities: Normal range of motion.     Mental Status: Normal mood and affect. Normal behavior. Normal judgment and thought content.   Assessment and Plan:  Pregnancy: G1P0 at 5968w4d  1. Elevated blood pressure complicating pregnancy in third trimester, antepartum  - Fetal nonstress test - US OB Limited --IOL for GHTN at 37 weeks   Term labor symptoms and general obstetric precautions including but not limited to vaginal bleeding, contractions, leaking of fluid and fetal movement were reviewed in detail with the patient. Please refer to After Visit Summary for other counseling recommendations.  Return in about 3  days (around 11/28/2016) for NST only.   Hurshel PartyLisa A Leftwich-Kirby, CNM

## 2016-11-28 ENCOUNTER — Ambulatory Visit (INDEPENDENT_AMBULATORY_CARE_PROVIDER_SITE_OTHER): Payer: Medicaid Other | Admitting: Family Medicine

## 2016-11-28 VITALS — BP 136/86 | HR 88

## 2016-11-28 DIAGNOSIS — O133 Gestational [pregnancy-induced] hypertension without significant proteinuria, third trimester: Secondary | ICD-10-CM

## 2016-11-28 DIAGNOSIS — O163 Unspecified maternal hypertension, third trimester: Secondary | ICD-10-CM

## 2016-11-28 NOTE — Progress Notes (Signed)
IOL scheduled 1/7 @ 0800.

## 2016-11-28 NOTE — Progress Notes (Signed)
NST reviewed and reactive.  

## 2016-11-29 ENCOUNTER — Other Ambulatory Visit: Payer: Self-pay | Admitting: Advanced Practice Midwife

## 2016-11-30 ENCOUNTER — Inpatient Hospital Stay (HOSPITAL_COMMUNITY)
Admission: RE | Admit: 2016-11-30 | Discharge: 2016-12-04 | DRG: 775 | Disposition: A | Discharge status: Home / Self Care | Payer: Medicaid Other | Source: Ambulatory Visit | Attending: Family Medicine | Admitting: Family Medicine

## 2016-11-30 VITALS — BP 127/75 | HR 71 | Temp 97.9°F | Resp 18 | Ht 62.0 in | Wt 187.0 lb

## 2016-11-30 DIAGNOSIS — O133 Gestational [pregnancy-induced] hypertension without significant proteinuria, third trimester: Secondary | ICD-10-CM | POA: Diagnosis present

## 2016-11-30 DIAGNOSIS — Z87891 Personal history of nicotine dependence: Secondary | ICD-10-CM

## 2016-11-30 DIAGNOSIS — Z3A37 37 weeks gestation of pregnancy: Secondary | ICD-10-CM

## 2016-11-30 DIAGNOSIS — O411230 Chorioamnionitis, third trimester, not applicable or unspecified: Secondary | ICD-10-CM | POA: Diagnosis present

## 2016-11-30 DIAGNOSIS — O1404 Mild to moderate pre-eclampsia, complicating childbirth: Secondary | ICD-10-CM | POA: Diagnosis present

## 2016-11-30 DIAGNOSIS — O134 Gestational [pregnancy-induced] hypertension without significant proteinuria, complicating childbirth: Secondary | ICD-10-CM | POA: Diagnosis present

## 2016-11-30 LAB — CBC
HCT: 38.9 % (ref 36.0–46.0)
Hemoglobin: 13.5 g/dL (ref 12.0–15.0)
MCH: 30 pg (ref 26.0–34.0)
MCHC: 34.7 g/dL (ref 30.0–36.0)
MCV: 86.4 fL (ref 78.0–100.0)
PLATELETS: 151 10*3/uL (ref 150–400)
RBC: 4.5 MIL/uL (ref 3.87–5.11)
RDW: 14.4 % (ref 11.5–15.5)
WBC: 11.1 10*3/uL — AB (ref 4.0–10.5)

## 2016-11-30 LAB — TYPE AND SCREEN
ABO/RH(D): O POS
ANTIBODY SCREEN: NEGATIVE

## 2016-11-30 LAB — ABO/RH: ABO/RH(D): O POS

## 2016-11-30 MED ORDER — LACTATED RINGERS IV SOLN: 500.0000 mL | INTRAVENOUS | Status: DC | PRN

## 2016-11-30 MED ORDER — ACETAMINOPHEN 325 MG PO TABS: 650.0000 mg | ORAL_TABLET | ORAL | Status: DC | PRN

## 2016-11-30 MED ORDER — FENTANYL CITRATE (PF) 100 MCG/2ML IJ SOLN: 100.0000 ug | INTRAMUSCULAR | Status: DC | PRN

## 2016-11-30 MED ORDER — LIDOCAINE HCL (PF) 1 % IJ SOLN
30.0000 mL | INTRAMUSCULAR | Status: AC | PRN
  Administered 2016-12-02: 30 mL via SUBCUTANEOUS
  Filled 2016-11-30: qty 30

## 2016-11-30 MED ORDER — OXYTOCIN 40 UNITS IN LACTATED RINGERS INFUSION - SIMPLE MED: 1.0000 m[IU]/min | INTRAVENOUS | Status: DC

## 2016-11-30 MED ORDER — OXYCODONE-ACETAMINOPHEN 5-325 MG PO TABS: 1.0000 | ORAL_TABLET | ORAL | Status: DC | PRN

## 2016-11-30 MED ORDER — MISOPROSTOL 25 MCG QUARTER TABLET
25.0000 ug | ORAL_TABLET | ORAL | Status: DC
  Administered 2016-11-30 – 2016-12-01 (×2): 25 ug via BUCCAL
  Filled 2016-11-30 (×2): qty 0.25

## 2016-11-30 MED ORDER — MISOPROSTOL 25 MCG QUARTER TABLET
25.0000 ug | ORAL_TABLET | ORAL | Status: DC | PRN
  Administered 2016-11-30 (×3): 25 ug via BUCCAL
  Filled 2016-11-30 (×3): qty 0.25

## 2016-11-30 MED ORDER — OXYTOCIN BOLUS FROM INFUSION
500.0000 mL | Freq: Once | INTRAVENOUS | Status: AC
  Administered 2016-12-02: 500 mL via INTRAVENOUS

## 2016-11-30 MED ORDER — TERBUTALINE SULFATE 1 MG/ML IJ SOLN: 0.2500 mg | Freq: Once | INTRAMUSCULAR | Status: DC | PRN

## 2016-11-30 MED ORDER — ONDANSETRON HCL 4 MG/2ML IJ SOLN
4.0000 mg | Freq: Four times a day (QID) | INTRAMUSCULAR | Status: DC | PRN
  Administered 2016-12-02: 4 mg via INTRAVENOUS
  Filled 2016-11-30: qty 2

## 2016-11-30 MED ORDER — OXYTOCIN 40 UNITS IN LACTATED RINGERS INFUSION - SIMPLE MED
2.5000 [IU]/h | INTRAVENOUS | Status: DC
  Filled 2016-11-30: qty 1000

## 2016-11-30 MED ORDER — HYDRALAZINE HCL 20 MG/ML IJ SOLN: 10.0000 mg | Freq: Once | INTRAMUSCULAR | Status: DC | PRN

## 2016-11-30 MED ORDER — LACTATED RINGERS IV SOLN
INTRAVENOUS | Status: DC
  Administered 2016-11-30 – 2016-12-01 (×5): via INTRAVENOUS

## 2016-11-30 MED ORDER — MISOPROSTOL 25 MCG QUARTER TABLET: 25.0000 ug | ORAL_TABLET | ORAL | Status: DC | PRN

## 2016-11-30 MED ORDER — SOD CITRATE-CITRIC ACID 500-334 MG/5ML PO SOLN: 30.0000 mL | ORAL | Status: DC | PRN

## 2016-11-30 MED ORDER — LABETALOL HCL 5 MG/ML IV SOLN: 20.0000 mg | INTRAVENOUS | Status: DC | PRN

## 2016-11-30 MED ORDER — OXYCODONE-ACETAMINOPHEN 5-325 MG PO TABS: 2.0000 | ORAL_TABLET | ORAL | Status: DC | PRN

## 2016-11-30 NOTE — Progress Notes (Signed)
Penny Duffy is a 36 y.o. G1P0 at 3050w0d by ultrasound admitted for induction of labor due to Hypertension.  Subjective:   Objective: BP (!) 135/91   Pulse 80   Temp 98.3 F (36.8 C) (Oral)   Resp 18   Ht 5\' 2"  (1.575 m)   Wt 187 lb (84.8 kg)   LMP 03/16/2016   BMI 34.20 kg/m  No intake/output data recorded. No intake/output data recorded.  FHT:  FHR: 130's bpm, variability: moderate,  accelerations:  Present,  decelerations:  Absent UC:   regular, every 2-5 minutes and very mild SVE:   Dilation: Closed Effacement (%): Thick Station: -3 Exam by:: Penny Matteina Jacobs, RN  Labs: Lab Results  Component Value Date   WBC 11.1 (H) 11/30/2016   HGB 13.5 11/30/2016   HCT 38.9 11/30/2016   MCV 86.4 11/30/2016   PLT 151 11/30/2016    Assessment / Plan: Induction of labor due to gestational hypertension,  progressing well on pitocin  Labor: yet to be in labor Preeclampsia:  no signs or symptoms of toxicity and intake and ouput balanced Fetal Wellbeing:  Category I Pain Control:  Labor support without medications I/D:  n/a Anticipated MOD:  NSVD  Penny Duffy 11/30/2016, 7:40 PM

## 2016-11-30 NOTE — H&P (Signed)
Penny Duffy is a 36 y.o. female G1 @ 37 wks presenting for IOL for GHTN. OB History    Gravida Para Term Preterm AB Living   1             SAB TAB Ectopic Multiple Live Births                 Past Medical History:  Diagnosis Date  . Medical history non-contributory   . Pregnancy induced hypertension    Past Surgical History:  Procedure Laterality Date  . NO PAST SURGERIES     Family History: family history is not on file. Social History:  reports that she has quit smoking. Her smoking use included Cigarettes. She has quit using smokeless tobacco. She reports that she does not use drugs. Her alcohol history is not on file.     Maternal Diabetes: No Genetic Screening: Normal Maternal Ultrasounds/Referrals: Normal Fetal Ultrasounds or other Referrals:  None Maternal Substance Abuse:  No Significant Maternal Medications:  None Significant Maternal Lab Results:  None Other Comments:  None  Review of Systems  Constitutional: Negative.   HENT: Negative.   Eyes: Negative.   Respiratory: Negative.   Cardiovascular: Negative.   Gastrointestinal: Negative.   Genitourinary: Negative.   Musculoskeletal: Negative.   Skin: Negative.   Neurological: Negative.   Endo/Heme/Allergies: Negative.   Psychiatric/Behavioral: Negative.    Maternal Medical History:  Reason for admission: IOL at 37 wks for GHTN  Contractions: none  Fetal activity: Perceived fetal activity is normal.   Last perceived fetal movement was within the past hour.    Prenatal complications: PIH.   Prenatal Complications - Diabetes: none.    Dilation: Closed Effacement (%): Thick Station: -3 Exam by:: Lajuana Matteina Jacobs, RN Blood pressure (!) 155/95, pulse 97, temperature 98.4 F (36.9 C), temperature source Oral, resp. rate 18, height 5\' 2"  (1.575 m), weight 187 lb (84.8 kg), last menstrual period 03/16/2016. Maternal Exam:  Uterine Assessment: No uc's  Abdomen: Patient reports no abdominal tenderness.  Fetal presentation: vertex  Introitus: Normal vulva. Normal vagina.  Ferning test: not done.  Nitrazine test: not done.  Pelvis: adequate for delivery.   Cervix: Cervix evaluated by digital exam.     Fetal Exam Fetal Monitor Review: Mode: ultrasound.   Variability: moderate (6-25 bpm).   Pattern: accelerations present.    Fetal State Assessment: Category I - tracings are normal.     Physical Exam  Constitutional: She is oriented to person, place, and time. She appears well-developed and well-nourished.  HENT:  Head: Normocephalic.  Eyes: Pupils are equal, round, and reactive to light.  Neck: Normal range of motion.  Cardiovascular: Normal rate, regular rhythm, normal heart sounds and intact distal pulses.   Respiratory: Effort normal and breath sounds normal.  GI: Soft. Bowel sounds are normal.  Genitourinary: Vagina normal and uterus normal.  Musculoskeletal: Normal range of motion.  Neurological: She is alert and oriented to person, place, and time. She has normal reflexes.  Skin: Skin is warm and dry.  Psychiatric: She has a normal mood and affect. Her behavior is normal. Judgment and thought content normal.    Prenatal labs: ABO, Rh: O/POS/-- (07/13 0851) Antibody: NEG (07/13 0851) Rubella: 1.43 (07/13 0851) RPR: NON REAC (10/30 0900)  HBsAg: NEGATIVE (07/13 0851)  HIV: NONREACTIVE (10/30 0900)  GBS:     Assessment/Plan: IOL @ 37 wks for GHTN SVD cl/firm/th/post/high. Vertex by us. Will start cytotec induction of labor   Wyvonnia DuskyMarie Lawson 11/30/2016, 10:25  AM    

## 2016-11-30 NOTE — Anesthesia Pain Management Evaluation Note (Signed)
  CRNA Pain Management Visit Note  Patient: Penny Duffy, 36 y.o., female  "Hello [facility] am [facility]. We have an anesthesia team available at all times to provide care throughout the hospital, including epidural management and anesthesia for C-section. I don't know your plan for the delivery whether it a natural birth, water birth, IV sedation, nitrous supplementation, doula or epidural, but we want to meet your pain goals."   1.Was your pain managed to your expectations on prior hospitalizations?   No prior hospitalizations  2.What is your expectation for pain management during this hospitalization?     Epidural  3.How can we help you reach that goal? epidural  Record the patient's initial score and the patient's pain goal.   Pain: 1  Pain Goal: 6 [facility] wants you to be able to say your pain was always managed very well.  Rica RecordsICKELTON,Scarlette Hogston 11/30/2016

## 2016-11-30 NOTE — Progress Notes (Signed)
Penny Duffy is a 36 y.o. G1P0 at 2514w0d by ultrasound admitted for induction of labor due to Hypertension.  Subjective:   Objective: BP (!) 155/95   Pulse 97   Temp 98.4 F (36.9 C) (Oral)   Resp 18   Ht 5\' 2"  (1.575 m)   Wt 187 lb (84.8 kg)   LMP 03/16/2016   BMI 34.20 kg/m  No intake/output data recorded. No intake/output data recorded.  FHT:  FHR: 120's bpm, variability: moderate,  accelerations:  Present,  decelerations:  Absent UC:   regular, every 5  Minutes and mild SVE:   Dilation: Closed Effacement (%): Thick Station: -3 Exam by:: Penny Matteina Jacobs, Penny Duffy  Labs: Lab Results  Component Value Date   WBC 11.1 (H) 11/30/2016   HGB 13.5 11/30/2016   HCT 38.9 11/30/2016   MCV 86.4 11/30/2016   PLT 151 11/30/2016    Assessment / Plan: Induction of labor due to gestational hypertension,  progressing well on pitocin  Labor: Progressing normally Preeclampsia:  no signs or symptoms of toxicity and intake and ouput balanced Fetal Wellbeing:  Category I Pain Control:  Labor support without medications I/D:  n/a Anticipated MOD:  NSVD  Penny Duffy 11/30/2016, 1:03 PM

## 2016-12-01 ENCOUNTER — Inpatient Hospital Stay (HOSPITAL_COMMUNITY): Payer: Medicaid Other | Admitting: Anesthesiology

## 2016-12-01 LAB — CBC
HCT: 37 % (ref 36.0–46.0)
HEMOGLOBIN: 12.8 g/dL (ref 12.0–15.0)
MCH: 30 pg (ref 26.0–34.0)
MCHC: 34.6 g/dL (ref 30.0–36.0)
MCV: 86.9 fL (ref 78.0–100.0)
PLATELETS: 120 10*3/uL — AB (ref 150–400)
RBC: 4.26 MIL/uL (ref 3.87–5.11)
RDW: 14.6 % (ref 11.5–15.5)
WBC: 20.1 10*3/uL — AB (ref 4.0–10.5)

## 2016-12-01 LAB — INFLUENZA PANEL BY PCR (TYPE A & B)
Influenza A By PCR: NEGATIVE
Influenza B By PCR: NEGATIVE

## 2016-12-01 LAB — DIFFERENTIAL
BASOS ABS: 0 10*3/uL (ref 0.0–0.1)
Basophils Relative: 0 %
EOS ABS: 0 10*3/uL (ref 0.0–0.7)
Eosinophils Relative: 0 %
LYMPHS ABS: 1.2 10*3/uL (ref 0.7–4.0)
Lymphocytes Relative: 6 %
Monocytes Absolute: 0.6 10*3/uL (ref 0.1–1.0)
Monocytes Relative: 3 %
NEUTROS PCT: 91 %
Neutro Abs: 17.7 10*3/uL — ABNORMAL HIGH (ref 1.7–7.7)

## 2016-12-01 LAB — RPR: RPR Ser Ql: NONREACTIVE

## 2016-12-01 MED ORDER — EPHEDRINE 5 MG/ML INJ
10.0000 mg | INTRAVENOUS | Status: DC | PRN
  Filled 2016-12-01: qty 4

## 2016-12-01 MED ORDER — MISOPROSTOL 200 MCG PO TABS
50.0000 ug | ORAL_TABLET | ORAL | Status: DC
  Administered 2016-12-01: 50 ug via BUCCAL
  Filled 2016-12-01: qty 0.5

## 2016-12-01 MED ORDER — LACTATED RINGERS IV SOLN: 500.0000 mL | Freq: Once | INTRAVENOUS | Status: DC

## 2016-12-01 MED ORDER — TERBUTALINE SULFATE 1 MG/ML IJ SOLN
0.2500 mg | Freq: Once | INTRAMUSCULAR | Status: DC | PRN
  Filled 2016-12-01: qty 1

## 2016-12-01 MED ORDER — FENTANYL 2.5 MCG/ML BUPIVACAINE 1/10 % EPIDURAL INFUSION (WH - ANES)
INTRAMUSCULAR | Status: AC
  Filled 2016-12-01: qty 100

## 2016-12-01 MED ORDER — ACETAMINOPHEN 500 MG PO TABS
1000.0000 mg | ORAL_TABLET | Freq: Once | ORAL | Status: AC
  Administered 2016-12-01: 1000 mg via ORAL
  Filled 2016-12-01: qty 2

## 2016-12-01 MED ORDER — PHENYLEPHRINE 40 MCG/ML (10ML) SYRINGE FOR IV PUSH (FOR BLOOD PRESSURE SUPPORT)
80.0000 ug | PREFILLED_SYRINGE | INTRAVENOUS | Status: DC | PRN
  Administered 2016-12-01: 80 ug via INTRAVENOUS
  Filled 2016-12-01: qty 5

## 2016-12-01 MED ORDER — LACTATED RINGERS IV SOLN
500.0000 mL | Freq: Once | INTRAVENOUS | Status: DC
  Administered 2016-12-01: 500 mL via INTRAVENOUS

## 2016-12-01 MED ORDER — SODIUM CHLORIDE 0.9 % IV SOLN
2.0000 g | Freq: Four times a day (QID) | INTRAVENOUS | Status: DC
  Administered 2016-12-01 – 2016-12-02 (×3): 2 g via INTRAVENOUS
  Filled 2016-12-01 (×4): qty 2000

## 2016-12-01 MED ORDER — EPHEDRINE 5 MG/ML INJ
10.0000 mg | INTRAVENOUS | Status: DC | PRN
  Filled 2016-12-01 (×2): qty 4

## 2016-12-01 MED ORDER — LIDOCAINE HCL (PF) 1 % IJ SOLN
INTRAMUSCULAR | Status: DC | PRN
  Administered 2016-12-01: 4 mL via EPIDURAL

## 2016-12-01 MED ORDER — EPHEDRINE 5 MG/ML INJ: 10.0000 mg | INTRAVENOUS | Status: DC | PRN

## 2016-12-01 MED ORDER — PHENYLEPHRINE 40 MCG/ML (10ML) SYRINGE FOR IV PUSH (FOR BLOOD PRESSURE SUPPORT): 80.0000 ug | PREFILLED_SYRINGE | INTRAVENOUS | Status: DC | PRN

## 2016-12-01 MED ORDER — OXYTOCIN 40 UNITS IN LACTATED RINGERS INFUSION - SIMPLE MED
1.0000 m[IU]/min | INTRAVENOUS | Status: DC
  Administered 2016-12-01: 2 m[IU]/min via INTRAVENOUS

## 2016-12-01 MED ORDER — FENTANYL 2.5 MCG/ML BUPIVACAINE 1/10 % EPIDURAL INFUSION (WH - ANES)
14.0000 mL/h | INTRAMUSCULAR | Status: DC | PRN
  Administered 2016-12-01 – 2016-12-02 (×2): 14 mL/h via EPIDURAL
  Filled 2016-12-01: qty 100

## 2016-12-01 MED ORDER — GENTAMICIN SULFATE 40 MG/ML IJ SOLN
150.0000 mg | Freq: Three times a day (TID) | INTRAVENOUS | Status: DC
  Administered 2016-12-01 – 2016-12-02 (×2): 150 mg via INTRAVENOUS
  Filled 2016-12-01 (×3): qty 3.75

## 2016-12-01 MED ORDER — FENTANYL CITRATE (PF) 100 MCG/2ML IJ SOLN
50.0000 ug | INTRAMUSCULAR | Status: DC | PRN
  Administered 2016-12-01 (×2): 100 ug via INTRAVENOUS
  Administered 2016-12-01 (×2): 50 ug via INTRAVENOUS
  Filled 2016-12-01 (×4): qty 2

## 2016-12-01 MED ORDER — PHENYLEPHRINE 40 MCG/ML (10ML) SYRINGE FOR IV PUSH (FOR BLOOD PRESSURE SUPPORT)
80.0000 ug | PREFILLED_SYRINGE | INTRAVENOUS | Status: DC | PRN
  Administered 2016-12-01 (×2): 80 ug via INTRAVENOUS
  Filled 2016-12-01: qty 5

## 2016-12-01 MED ORDER — PHENYLEPHRINE 40 MCG/ML (10ML) SYRINGE FOR IV PUSH (FOR BLOOD PRESSURE SUPPORT)
PREFILLED_SYRINGE | INTRAVENOUS | Status: AC
  Filled 2016-12-01: qty 20

## 2016-12-01 MED ORDER — DIPHENHYDRAMINE HCL 50 MG/ML IJ SOLN: 12.5000 mg | INTRAMUSCULAR | Status: DC | PRN

## 2016-12-01 NOTE — Anesthesia Preprocedure Evaluation (Signed)
Anesthesia Evaluation  Patient identified by MRN, date of birth, ID band Patient awake    Reviewed: Allergy & Precautions, NPO status , Patient's Chart, lab work & pertinent test results  Airway Mallampati: II  TM Distance: >3 FB Neck ROM: Full    Dental  (+) Teeth Intact, Dental Advisory Given   Pulmonary neg pulmonary ROS, former smoker,    breath sounds clear to auscultation       Cardiovascular hypertension,  Rhythm:Regular Rate:Normal     Neuro/Psych negative neurological ROS  negative psych ROS   GI/Hepatic negative GI ROS, Neg liver ROS,   Endo/Other  negative endocrine ROS  Renal/GU negative Renal ROS  negative genitourinary   Musculoskeletal negative musculoskeletal ROS (+)   Abdominal   Peds negative pediatric ROS (+)  Hematology negative hematology ROS (+)   Anesthesia Other Findings   Reproductive/Obstetrics (+) Pregnancy                             Lab Results  Component Value Date   WBC 20.1 (H) 12/01/2016   HGB 12.8 12/01/2016   HCT 37.0 12/01/2016   MCV 86.9 12/01/2016   PLT 120 (L) 12/01/2016   No results found for: INR, PROTIME    Anesthesia Physical Anesthesia Plan  ASA: II  Anesthesia Plan: Epidural   Post-op Pain Management:    Induction:   Airway Management Planned:   Additional Equipment:   Intra-op Plan:   Post-operative Plan:   Informed Consent: I have reviewed the patients History and Physical, chart, labs and discussed the procedure including the risks, benefits and alternatives for the proposed anesthesia with the patient or authorized representative who has indicated his/her understanding and acceptance.     Plan Discussed with:   Anesthesia Plan Comments:         Anesthesia Quick Evaluation

## 2016-12-01 NOTE — Anesthesia Procedure Notes (Signed)
Epidural Patient location during procedure: OB Start time: 12/01/2016 8:10 PM End time: 12/01/2016 8:17 PM  Staffing Anesthesiologist: Shona SimpsonHOLLIS, Penny Grimsley D Performed: anesthesiologist   Preanesthetic Checklist Completed: patient identified, site marked, surgical consent, pre-op evaluation, timeout performed, IV checked, risks and benefits discussed and monitors and equipment checked  Epidural Patient position: sitting Prep: ChloraPrep Patient monitoring: heart rate, continuous pulse ox and blood pressure Approach: midline Location: L3-L4 Injection technique: LOR saline  Needle:  Needle type: Tuohy  Needle gauge: 17 G Needle length: 9 cm Catheter type: closed end flexible Catheter size: 20 Guage Test dose: negative and 1.5% lidocaine  Assessment Events: blood not aspirated, injection not painful, no injection resistance and no paresthesia  Additional Notes LOR @ 6.5  Patient identified. Risks/Benefits/Options discussed with patient including but not limited to bleeding, infection, nerve damage, paralysis, failed block, incomplete pain control, headache, blood pressure changes, nausea, vomiting, reactions to medications, itching and postpartum back pain. Confirmed with bedside nurse the patient's most recent platelet count. Confirmed with patient that they are not currently taking any anticoagulation, have any bleeding history or any family history of bleeding disorders. Patient expressed understanding and wished to proceed. All questions were answered. Sterile technique was used throughout the entire procedure. Please see nursing notes for vital signs. Test dose was given through epidural catheter and negative prior to continuing to dose epidural or start infusion. Warning signs of high block given to the patient including shortness of breath, tingling/numbness in hands, complete motor block, or any concerning symptoms with instructions to call for help. Patient was given instructions on fall  risk and not to get out of bed. All questions and concerns addressed with instructions to call with any issues or inadequate analgesia.    Reason for block:procedure for pain

## 2016-12-01 NOTE — Progress Notes (Signed)
Patient ID: Penny Duffy, female   DOB: 03/16/1981, 36 y.o.   MRN: [id number]  S: Patient seen & examined for progress of labor. Patient comfortable with epidural.   O:  Vitals:   12/01/16 2100 12/01/16 2130 12/01/16 2200 12/01/16 2233  BP: 130/86 126/65 121/73 120/68  Pulse: 86 (!) 114 (!) 121 100  Resp:      Temp:  99 F (37.2 C)  98.8 F (37.1 C)  TempSrc:  Oral  Oral  SpO2: 100%     Weight:      Height:        Dilation: 6 Effacement (%): 70 Cervical Position: Middle Station: -2 Presentation: Vertex Exam by:: Bhambri  AROM performed, clear fluid returned.   FHT: 145 bpm, mod var, no accels, a few variable decels TOCO: Irregular, recently restarted pitocin   A/P: AROM performed, clear fluid Will reassess if needs IUPC Triple I - fevers improved, FHT no longer tachycardic, Amp/Gent continued BPs normal Continue expectant management Anticipate SVD   Cleda ClarksElizabeth W. Scarleth Brame, DO  OB Fellow Center for [facility], [facility]

## 2016-12-01 NOTE — Progress Notes (Signed)
Labor Progress Note Penny Duffy is a 36 y.o. G1P0 at 4426w1d presented for IOl for GHTN.  S:  Comfortable, feeling occasional ctx. Pain meds prior to exam per her request, hx intolerant of SVE.  O:  BP 136/90   Pulse 88   Temp 98.5 F (36.9 C) (Oral)   Resp 18   Ht 5\' 2"  (1.575 m)   Wt 84.8 kg (187 lb)   LMP 03/16/2016   BMI 34.20 kg/m  EFM: baseline 135 bpm/ mod variability/ + accels/ no decels  Toco: 3-6 SVE: Dilation: 1 Effacement (%): 80 Cervical Position: Middle Station: -2 Presentation: Vertex Exam by:: [place], CNM  A/P: 36 y.o. G1P0 4426w1d  1. Labor: latent 2. FWB: Cat I 3. Pain: Fentanyl 4. Gestational HTN-stable  BP stable. Cervical balloon placed, tolerated well. Continue Cytotec. Pitocin once foley out. Anticipate SVD.  Donette LarryMelanie Gia Lusher, CNM 10:51 AM

## 2016-12-01 NOTE — Progress Notes (Addendum)
Penny Duffy is a 36 y.o. G1P0 at 7798w1d by ultrasound admitted for induction of labor due to gestational hypertension.  Subjective: Patient feeling contractions but pain is tolerable. States cervical checks are extremely painful for her. Anxious if her labor is normal or not.   Objective: BP (!) 138/97   Pulse 88   Temp 97.9 F (36.6 C) (Oral)   Resp 18   Ht 5\' 2"  (1.575 m)   Wt 84.8 kg (187 lb)   LMP 03/16/2016   BMI 34.20 kg/m  No intake/output data recorded. No intake/output data recorded.  FHT:  FHR: 135 bpm, variability: moderate,  accelerations:  Present,  decelerations:  Absent UC:   regular, every 2-4 minutes SVE:   Dilation: Fingertip Effacement (%): 50 Station: -3 Exam by:: Irving BurtonEmily Rothermel RN   Labs: Lab Results  Component Value Date   WBC 11.1 (H) 11/30/2016   HGB 13.5 11/30/2016   HCT 38.9 11/30/2016   MCV 86.4 11/30/2016   PLT 151 11/30/2016    Assessment / Plan: Induction of labor due to gestational hypertension,  progressing well on pitocin  Labor: Not in labor yet, cervix making minimal change, will continue with buccal Cytotec for now Preeclampsia:  no signs or symptoms of toxicity Fetal Wellbeing:  Category I Pain Control:  IV pain meds and Epidural when patient in more active phase of labor I/D:  GBS neg Anticipated MOD:  NSVD  Beaulah DinningChristina M Gambino 12/01/2016, 12:07 AM

## 2016-12-01 NOTE — Progress Notes (Addendum)
Luisa Dagoracy Dacosta is a 36 y.o. G1P0 at 582w1d  ad[facility]  Subjective: Patient doing well, comfortable with her epidural. Not feeling febrile at this time. Denies any cough, shortness of breath, chest pain.   Objective: BP 126/65   Pulse (!) 114   Temp 99 F (37.2 C) (Oral)   Resp 18   Ht 5\' 2"  (1.575 m)   Wt 84.8 kg (187 lb)   LMP 03/16/2016   SpO2 100%   BMI 34.20 kg/m  No intake/output data recorded. No intake/output data recorded.  FHT:  FHR: 155 bpm, variability: moderate,  accelerations:  Present,  decelerations:  Present some variable decels and 1 long decel around 2040, no decels in last 20 minutes UC:   regular, every 3-5 minutes SVE:   Dilation: 6 Effacement (%): 70 Station: -2 Exam by:: UGI CorporationBhambri  Labs: Lab Results  Component Value Date   WBC 20.1 (H) 12/01/2016   HGB 12.8 12/01/2016   HCT 37.0 12/01/2016   MCV 86.9 12/01/2016   PLT 120 (L) 12/01/2016    Assessment / Plan: Induction of labor due to gestational hypertension,  progressing well   Labor: Progressing normally s/p FB. Pit was stopped after decelerations but FHT normal now. Resume Pitocin. Will attempt AROM at next cervical check likely Preeclampsia:  no signs or symptoms of toxicity Fetal Wellbeing:  Category II Pain Control:  Epidural and IV pain meds I/D:  GBS neg, Triple I; Amp and Gent. Tylenol PRN  Anticipated MOD:  NSVD  Beaulah DinningChristina M Myda Detwiler 12/01/2016, 9:43 PM

## 2016-12-01 NOTE — Progress Notes (Signed)
OB Interim Progress Note  S: Evaluated patient for fever.   O: BP 140/80   Pulse (!) 108   Temp (!) 101.4 F (38.6 C) (Oral)   Resp 16   Ht 5\' 2"  (1.575 m)   Wt 84.8 kg (187 lb)   LMP 03/16/2016   BMI 34.20 kg/m   General: patient lying in LL decubitus position appearing uncomfortable, but in NAD with damp towel on forehead Cardiac: RRR, no MRG Resp: CTABL Extremities: no edema Skin: chest and face both erythematous and warm to touch, chest >face  Dilation: 5.5 Effacement (%): 80 Cervical Position: Middle Station: -2 Presentation: Vertex Exam by:: Craige CottaAmanda Loye, RNC    A/P: 36yo F G1PO at 5.5cm and 80% effacement with -2 station with fever of unknown origin >101 and fetal tachycardia >160 with suspected Triple I. Additionally, patient did complain of upper respiratory symptoms and could have flu.  - ampicillin 2g q6hrs - getamycin 150mg  q8hrs - tylenol 1g once - continue LR @125cc Hosie Spangle/hhr - continue exp extant management - anticipate SVD -  f/u flu-swab  Renne Muscaaniel L Joncarlo Friberg, MD 12/01/2016, 7:03 PM PGY-1

## 2016-12-01 NOTE — Progress Notes (Signed)
Pharmacy Antibiotic Note  Penny Duffy is a 36 y.o. female admitted on 11/30/2016 for IOL due to Gestational HTN.  Pharmacy has been consulted for Gentamicin dosing for Triple I due to maternal temp during labor  Plan: Gentamicin 150mg  IV q8h  Height: 5\' 2"  (157.5 cm) Weight: 187 lb (84.8 kg) IBW/kg (Calculated) : 50.1  Adjusted BW: 60.5kg  Temp (24hrs), Avg:99.1 F (37.3 C), Min:97.9 F (36.6 C), Max:101.4 F (38.6 C)   Recent Labs Lab 11/30/16 0955 12/01/16 1805  WBC 11.1* 20.1*    Estimated Creatinine Clearance: 98.2 mL/min (by C-Duffy formula based on SCr of 0.54 mg/dL).    No Known Allergies  Antimicrobials this admission: Ampicillin 2 gram IV q6h  1/8 >>  Dose adjustments this admission:   Microbiology results:   Thank you for allowing pharmacy to be a part of this patient's care.  Penny Duffy, Penny Duffy 12/01/2016 7:01 PM

## 2016-12-02 ENCOUNTER — Encounter: Payer: Medicaid Other | Admitting: Advanced Practice Midwife

## 2016-12-02 ENCOUNTER — Encounter (HOSPITAL_COMMUNITY): Payer: Self-pay

## 2016-12-02 DIAGNOSIS — Z3A37 37 weeks gestation of pregnancy: Secondary | ICD-10-CM

## 2016-12-02 DIAGNOSIS — O134 Gestational [pregnancy-induced] hypertension without significant proteinuria, complicating childbirth: Secondary | ICD-10-CM

## 2016-12-02 LAB — COMPREHENSIVE METABOLIC PANEL
ALT: 16 U/L (ref 14–54)
ANION GAP: 9 (ref 5–15)
AST: 20 U/L (ref 15–41)
Albumin: 2.5 g/dL — ABNORMAL LOW (ref 3.5–5.0)
Alkaline Phosphatase: 141 U/L — ABNORMAL HIGH (ref 38–126)
BILIRUBIN TOTAL: 0.6 mg/dL (ref 0.3–1.2)
BUN: 7 mg/dL (ref 6–20)
CHLORIDE: 102 mmol/L (ref 101–111)
CO2: 22 mmol/L (ref 22–32)
Calcium: 8.7 mg/dL — ABNORMAL LOW (ref 8.9–10.3)
Creatinine, Ser: 0.61 mg/dL (ref 0.44–1.00)
GFR calc Af Amer: >60 mL/min (ref >60)
Glucose, Bld: 111 mg/dL — ABNORMAL HIGH (ref 65–99)
POTASSIUM: 3.5 mmol/L (ref 3.5–5.1)
Sodium: 133 mmol/L — ABNORMAL LOW (ref 135–145)
TOTAL PROTEIN: 6 g/dL — AB (ref 6.5–8.1)

## 2016-12-02 LAB — PROTEIN / CREATININE RATIO, URINE
CREATININE, URINE: 45 mg/dL
PROTEIN CREATININE RATIO: 1.89 mg/mg{creat} — AB (ref 0.00–0.15)
TOTAL PROTEIN, URINE: 85 mg/dL

## 2016-12-02 MED ORDER — ACETAMINOPHEN 325 MG PO TABS
650.0000 mg | ORAL_TABLET | ORAL | Status: DC | PRN
  Administered 2016-12-02: 650 mg via ORAL
  Filled 2016-12-02 (×2): qty 2

## 2016-12-02 MED ORDER — WITCH HAZEL-GLYCERIN EX PADS: 1.0000 "application " | MEDICATED_PAD | CUTANEOUS | Status: DC | PRN

## 2016-12-02 MED ORDER — TETANUS-DIPHTH-ACELL PERTUSSIS 5-2.5-18.5 LF-MCG/0.5 IM SUSP: 0.5000 mL | Freq: Once | INTRAMUSCULAR | Status: DC

## 2016-12-02 MED ORDER — PRENATAL MULTIVITAMIN CH
1.0000 | ORAL_TABLET | Freq: Every day | ORAL | Status: DC
  Administered 2016-12-03 – 2016-12-04 (×2): 1 via ORAL
  Filled 2016-12-02 (×3): qty 1

## 2016-12-02 MED ORDER — PRENATAL MULTIVITAMIN CH: 1.0000 | ORAL_TABLET | Freq: Every day | ORAL | Status: DC

## 2016-12-02 MED ORDER — ACETAMINOPHEN 500 MG PO TABS
1000.0000 mg | ORAL_TABLET | Freq: Four times a day (QID) | ORAL | Status: DC | PRN
  Administered 2016-12-02: 1000 mg via ORAL
  Filled 2016-12-02: qty 2

## 2016-12-02 MED ORDER — ONDANSETRON HCL 4 MG PO TABS: 4.0000 mg | ORAL_TABLET | ORAL | Status: DC | PRN

## 2016-12-02 MED ORDER — SODIUM CHLORIDE 0.9 % IV SOLN
2.0000 g | Freq: Four times a day (QID) | INTRAVENOUS | Status: DC
  Administered 2016-12-02 – 2016-12-03 (×3): 2 g via INTRAVENOUS
  Filled 2016-12-02 (×4): qty 2000

## 2016-12-02 MED ORDER — SENNOSIDES-DOCUSATE SODIUM 8.6-50 MG PO TABS
2.0000 | ORAL_TABLET | ORAL | Status: DC
  Administered 2016-12-03 – 2016-12-04 (×2): 2 via ORAL
  Filled 2016-12-02 (×2): qty 2

## 2016-12-02 MED ORDER — DEXTROSE 5 % IV SOLN
5.0000 mg/kg | Freq: Once | INTRAVENOUS | Status: AC
  Administered 2016-12-02: 320 mg via INTRAVENOUS
  Filled 2016-12-02: qty 8

## 2016-12-02 MED ORDER — DIBUCAINE 1 % RE OINT: 1.0000 "application " | TOPICAL_OINTMENT | RECTAL | Status: DC | PRN

## 2016-12-02 MED ORDER — COCONUT OIL OIL: 1.0000 "application " | TOPICAL_OIL | Status: DC | PRN

## 2016-12-02 MED ORDER — MISOPROSTOL 200 MCG PO TABS
600.0000 ug | ORAL_TABLET | Freq: Once | ORAL | Status: AC
  Administered 2016-12-02: 600 ug via ORAL

## 2016-12-02 MED ORDER — DIPHENHYDRAMINE HCL 25 MG PO CAPS: 25.0000 mg | ORAL_CAPSULE | Freq: Four times a day (QID) | ORAL | Status: DC | PRN

## 2016-12-02 MED ORDER — ONDANSETRON HCL 4 MG/2ML IJ SOLN: 4.0000 mg | INTRAMUSCULAR | Status: DC | PRN

## 2016-12-02 MED ORDER — MISOPROSTOL 200 MCG PO TABS
ORAL_TABLET | ORAL | Status: AC
  Filled 2016-12-02: qty 3

## 2016-12-02 MED ORDER — BENZOCAINE-MENTHOL 20-0.5 % EX AERO
1.0000 "application " | INHALATION_SPRAY | CUTANEOUS | Status: DC | PRN
  Administered 2016-12-03: 1 via TOPICAL
  Filled 2016-12-02: qty 56

## 2016-12-02 MED ORDER — IBUPROFEN 600 MG PO TABS
600.0000 mg | ORAL_TABLET | Freq: Four times a day (QID) | ORAL | Status: DC
  Administered 2016-12-02 – 2016-12-04 (×9): 600 mg via ORAL
  Filled 2016-12-02 (×9): qty 1

## 2016-12-02 MED ORDER — ZOLPIDEM TARTRATE 5 MG PO TABS: 5.0000 mg | ORAL_TABLET | Freq: Every evening | ORAL | Status: DC | PRN

## 2016-12-02 MED ORDER — LACTATED RINGERS IV SOLN
INTRAVENOUS | Status: DC
  Administered 2016-12-02: 08:00:00 via INTRAUTERINE

## 2016-12-02 MED ORDER — SIMETHICONE 80 MG PO CHEW: 80.0000 mg | CHEWABLE_TABLET | ORAL | Status: DC | PRN

## 2016-12-02 NOTE — Lactation Note (Signed)
This note was copied from a baby's chart. Lactation Consultation Note  Patient Name: Penny Duffy [id number] Date: 12/02/2016 Reason for consult: Initial assessment   Initial consult at 1 hrs old; GA 37.2; BW 7 lbs, 1.4; Mom is P1 +THC use.  Hx chorio- amp and Gent given Infant has not had any breastfeedings just attempt x1 (0); voids-0; stools-0. Parents had just bottle fed 5 ml alimentum because "I don't think there is anything there." Reviewed hand expression with mom but mom had difficulty following directions and return demonstration.  LC was able to easily hand express colostrum.  As [place] was teaching hand expression mom asked if we could just latch infant;  LC had dad unwrap infant to put her STS with mom on left breast football hold. Infant was crying but would not latch.  Spoon fed .5 but infant kept crying inconsolably and mom was getting anxious, so LC suggested mom put infant upright on chest STS. Infant calmed and quit crying.  Benefits of STS discussed with parents and encouraged. Educated on feeding cues and to latch when infant begins to show these feeding cues.  Educated on size of infant's stomach and cluster feeding. Late Preterm Infant Green sheet given and discussed.  Discussed importance of hand expressing and giving extra colostrum back to infant.   Additional colostrum that was collected is at bedside in a colostrum collection container (additional 1 ml).  Curved-tip syringe, containers, and spoons given. RN has pump kit in room, and shown how to use on "initiate" setting turning dial to 3-4 drops.  Taught how to use hands-on pumping and encouraged hand expression at end of pumping session.   Lactation brochure given and informed of hospital support group and outpatient services. Mom will need assistance with latching and reinforcement teaching.     Maternal Data Has patient been taught Hand Expression?: Yes (needs reinforcement teaching; mom has colostrum) Does the  patient have breastfeeding experience prior to this delivery?: No  Feeding Feeding Type: Bottle Fed - Formula Nipple Type: Slow - flow Length of feed: 0 min  LATCH Score/Interventions Latch: Too sleepy or reluctant, no latch achieved, no sucking elicited.     Type of Nipple: Everted at rest and after stimulation  Comfort (Breast/Nipple): Soft / non-tender     Hold (Positioning): Assistance needed to correctly position infant at breast and maintain latch. Intervention(s): Breastfeeding basics reviewed;Skin to skin     Lactation Tools Discussed/Used     Consult Status Consult Status: Follow-up Date: 12/03/16 Follow-up type: In-patient    Merlene Laughter 12/02/2016, 6:19 PM

## 2016-12-02 NOTE — Progress Notes (Signed)
*  Interum Note*  Patient doing well, resting comfortably. Placed IUPC around 0100. CMP and Urine P/Cr collected as patient had some severe range HTN today. Urine P/Cr showing signs of Pre-E. Will continue to monitor closely.   Anders Simmondshristina Teriana Danker, MD [facility], PGY-2

## 2016-12-02 NOTE — Lactation Note (Signed)
This note was copied from a baby's chart. Lactation Consultation Note  Patient Name: Penny Duffy ZOXWR'UToday's Date: 12/02/2016 Reason for consult: Initial assessment  RN told LC patient does not wish to see lactation again.  RN did not know reasoning.   Lendon KaVann, Damaris Abeln Walker 12/02/2016, 8:35 PM

## 2016-12-02 NOTE — Progress Notes (Signed)
Penny Duffy is a 36 y.o. G1P0 at 437w2d admitted for [facility]  Subjective:  Patient doing well. She is pushing right now. Pain controlled with epidural.   Objective: BP 116/72   Pulse 92   Temp 98.3 F (36.8 C)   Resp 18   Ht 5\' 2"  (1.575 m)   Wt 84.8 kg (187 lb)   LMP 03/16/2016   SpO2 96%   BMI 34.20 kg/m  No intake/output data recorded. No intake/output data recorded.  FHT:  FHR: 150 bpm, variability: moderate,  accelerations:  Present,  decelerations:  Present early UC:   regular, every 2-3 minutes SVE:   Dilation: 10 Effacement (%): 100 Station: +1 Exam by:: S [employer] CorporationMoyer  Labs: Lab Results  Component Value Date   WBC 20.1 (H) 12/01/2016   HGB 12.8 12/01/2016   HCT 37.0 12/01/2016   MCV 86.9 12/01/2016   PLT 120 (L) 12/01/2016    Assessment / Plan: Induction of labor due to gestational hypertension,  progressing well on pitocin  Labor: Progressing normally, pushing now.  Preeclampsia:  no signs or symptoms of toxicity Fetal Wellbeing:  Category I Pain Control:  Epidural I/D:  GBS neg Anticipated MOD:  NSVD  Penny Duffy 12/02/2016, 7:44 AM

## 2016-12-02 NOTE — Anesthesia Postprocedure Evaluation (Signed)
Anesthesia Post Note  Patient: Penny Duffy  Procedure(s) Performed: * No procedures listed *  Patient location during evaluation: Mother Baby Anesthesia Type: Epidural Level of consciousness: awake, awake and alert and oriented Pain management: pain level controlled Vital Signs Assessment: post-procedure vital signs reviewed and stable Respiratory status: spontaneous breathing, nonlabored ventilation and respiratory function stable Cardiovascular status: stable Postop Assessment: no headache, no backache, patient able to bend at knees, no signs of nausea or vomiting and adequate PO intake Anesthetic complications: no        Last Vitals:  Vitals:   12/02/16 1202 12/02/16 1317  BP: (!) 144/58 (!) 141/76  Pulse: (!) 105 (!) 101  Resp: 18   Temp: 37.2 C     Last Pain:  Vitals:   12/02/16 1202  TempSrc: Oral  PainSc:    Pain Goal: Patients Stated Pain Goal: 5 (12/01/16 1931)               Yamin Swingler

## 2016-12-02 NOTE — Progress Notes (Signed)
Patient ID: Penny Duffy, female   DOB: 29-Jan-1981, 36 y.o.   MRN: [id number]  S: Patient seen & examined for progress of labor. Patient comfortable with epidural, sleeping in room.    O:  Vitals:   12/02/16 0031 12/02/16 0101 12/02/16 0131 12/02/16 0201  BP: (!) 142/90 136/78 116/63 129/86  Pulse: 90 (!) 108 (!) 109 (!) 115  Resp:      Temp: 99.1 F (37.3 C)     TempSrc: Oral     SpO2:      Weight:      Height:        Dilation: 5 Effacement (%): 100 Cervical Position: Middle Station: -1 Presentation: Vertex Exam by:: Analia Zuk   FHT: 150 bpm, min to mod var, no accels, no decels IUPC: Inadequate <22900mvu, q2-3 min.   A/P: Continue increasing pitocin until adequate contractions Continue expectant management Anticipate SVD

## 2016-12-03 MED ORDER — OXYCODONE-ACETAMINOPHEN 5-325 MG PO TABS
1.0000 | ORAL_TABLET | Freq: Four times a day (QID) | ORAL | Status: DC | PRN
  Administered 2016-12-03 – 2016-12-04 (×3): 1 via ORAL
  Filled 2016-12-03 (×3): qty 1

## 2016-12-03 MED ORDER — IBUPROFEN 600 MG PO TABS: 600.0000 mg | ORAL_TABLET | Freq: Four times a day (QID) | ORAL | 0 refills | Status: DC

## 2016-12-03 MED ORDER — ACETAMINOPHEN 325 MG PO TABS: 650.0000 mg | ORAL_TABLET | ORAL | 0 refills | Status: DC | PRN

## 2016-12-03 NOTE — Progress Notes (Signed)
CSW attempted again to meet with MOB, but she still has visitors.  CSW will attempt again if possible. 

## 2016-12-03 NOTE — Discharge Summary (Signed)
OB Discharge Summary     Patient Name: Penny Duffy DOB: 1981/02/11 MRN: [id number]  Date of admission: 11/30/2016 Delivering MD: Shonna ChockWOUK, NOAH BEDFORD   Date of discharge: 12/03/2016  Admitting diagnosis: induction Intrauterine pregnancy: 7643w2d     Secondary diagnosis:  Active Problems:   Gestational hypertension, third trimester   SVD (spontaneous vaginal delivery)  Additional problems: mild pre-eclampsia, tripple I     Discharge diagnosis: Term Pregnancy Delivered                                                                                                Post partum procedures:none  Augmentation: AROM, Pitocin, Cytotec and Foley Balloon  Complications: Intrauterine Inflammation or infection (Chorioamniotis)  Hospital course:  Induction of Labor With Vaginal Delivery   36 y.o. yo G1P1001 at 1143w2d was admitted to the hospital 11/30/2016 for induction of labor.  Indication for induction: Gestational hypertension.  Patient had an uncomplicated labor course as follows: Membrane Rupture Time/Date: 11:25 PM ,12/01/2016   Intrapartum Procedures: Episiotomy: None [1]                                         Lacerations:  1st degree [2]  Patient had delivery of a Viable infant.  Information for the patient's newborn:  Omar PersonBurroughs, Girl French Anaracy [[id number]][[id number]]  Delivery Method: Vag-Spont   12/02/2016  Details of delivery can be found in separate delivery note.  Patient had a routine postpartum course. Patient is discharged home 12/03/16.   Physical exam Vitals:   12/02/16 1605 12/02/16 1700 12/03/16 0035 12/03/16 0541  BP: (!) 119/94 119/82 122/79 (!) 110/50  Pulse: 100 88 82 74  Resp: 18  18 18   Temp: 97.9 F (36.6 C)  98.2 F (36.8 C) 98.2 F (36.8 C)  TempSrc: Oral  Oral Oral  SpO2:      Weight:      Height:       General: alert, cooperative and no distress Lochia: appropriate Uterine Fundus: firm Incision: N/A DVT Evaluation: No evidence of DVT seen on physical exam. No  significant calf/ankle edema. Labs: Lab Results  Component Value Date   WBC 20.1 (H) 12/01/2016   HGB 12.8 12/01/2016   HCT 37.0 12/01/2016   MCV 86.9 12/01/2016   PLT 120 (L) 12/01/2016   CMP Latest Ref Rng & Units 12/01/2016  Glucose 65 - 99 mg/dL 782(N111(H)  BUN 6 - 20 mg/dL 7  Creatinine 5.620.44 - 1.301.00 mg/dL 8.650.61  Sodium 784135 - 696145 mmol/L 133(L)  Potassium 3.5 - 5.1 mmol/L 3.5  Chloride 101 - 111 mmol/L 102  CO2 22 - 32 mmol/L 22  Calcium 8.9 - 10.3 mg/dL 2.9(B8.7(L)  Total Protein 6.5 - 8.1 g/dL 6.0(L)  Total Bilirubin 0.3 - 1.2 mg/dL 0.6  Alkaline Phos 38 - 126 U/L 141(H)  AST 15 - 41 U/L 20  ALT 14 - 54 U/L 16    Discharge instruction: per After Visit Summary and "Baby and Me Booklet".  After visit meds:  Allergies as of 12/03/2016   No Known Allergies     Medication List    TAKE these medications   acetaminophen 325 MG tablet Commonly known as:  TYLENOL Take 2 tablets (650 mg total) by mouth every 4 (four) hours as needed (for pain scale < 4).   ibuprofen 600 MG tablet Commonly known as:  ADVIL,MOTRIN Take 1 tablet (600 mg total) by mouth every 6 (six) hours.   multivitamin-prenatal 27-0.8 MG Tabs tablet Take 1 tablet by mouth daily at 12 noon.       Diet: routine diet  Activity: Advance as tolerated. Pelvic rest for 6 weeks.   Outpatient follow up:6 weeks Follow up Appt:Future Appointments Date Time Provider Department Center  01/13/2017 2:40 PM Marylene Land, [place] [facility]   Follow up Visit:No Follow-up on file.  Postpartum contraception: Combination OCPs  Newborn Data: Live born female  Birth Weight: 7 lb 1.4 oz (3215 g) APGAR: 9, 9  Baby Feeding: Breast Disposition:home with mother   12/03/2016 Ernestina Penna, MD

## 2016-12-03 NOTE — Lactation Note (Signed)
This note was copied from a baby's chart. Lactation Consultation Note  Patient Name: Penny Duffy ZOXWR'UToday's Date: 12/03/2016 Reason for consult: Follow-up assessment Baby at 28 hr of life. Family was in the CN. Parents were pleasant and thanked lactation for "checking in". Mom stated she is "working" on bf. She plans to start pumping today. There is DEBP set up in the room. Reviewed pump settings, cleaning, and milk handling. She denies breast or nipple pain, voiced no concerns. She is aware of lactation services and support group. She will call for help the next time she latches baby.   Maternal Data    Feeding    LATCH Score/Interventions                      Lactation Tools Discussed/Used     Consult Status Consult Status: Follow-up Date: 12/04/16 Follow-up type: In-patient    Penny Duffy 12/03/2016, 12:57 PM

## 2016-12-03 NOTE — Progress Notes (Signed)
Called Dr at approximately 2115 per pt request for medication related to discomfort from swelling, obtained PRN order for percocet.      Pt and FOB asked me to come into the room to talk about the pt's situation and background information contributing to her emotional state. She explained that several things are contributing to her being upset: per her own admission, she feels like she has not slept in two days and at the time of the conversation had not eaten yet today. She feels like her hormones are contributing to her emotional state as well. Further, on Monday of this week she unexpectedly lost her dog of 14 years. She brought a picture of him, his ball, and his blanket to the hospital with her. She stated that the dog died of respiratory distress, which increases her worry about her baby in the NICU, who also was having trouble breathing. She understands that the difference between the two situations but the similarity still concerns her.  She is worried about being discharged without her baby. I offered emotional support and reassurance, and discussed the discharge process with her. I offered a consult to spiritual care, which she declined at this time.

## 2016-12-03 NOTE — Progress Notes (Signed)
As I was going into the room to give medication, I heard patient and FOB arguing from the hallway, using some profanity. When I entered, both were calm, FOB was reattaching the footboard of the bed. Patient went to go take a shower, I followed her to the bathroom and asked about the argument. She said that she was "very upset about her baby being in the NICU, and that FOB felt like she was taking it too far." She stated she just needed to take a shower, and that she was fine with the FOB remaining in her room. FOB also maintained that he was all right and left the room as well.

## 2016-12-03 NOTE — Discharge Instructions (Signed)

## 2016-12-04 ENCOUNTER — Encounter: Payer: Medicaid Other | Admitting: Advanced Practice Midwife

## 2016-12-04 ENCOUNTER — Ambulatory Visit: Payer: Self-pay

## 2016-12-04 MED ORDER — FUROSEMIDE 20 MG PO TABS
20.0000 mg | ORAL_TABLET | Freq: Once | ORAL | Status: AC
  Administered 2016-12-04: 20 mg via ORAL
  Filled 2016-12-04: qty 1

## 2016-12-04 MED ORDER — TRIAMTERENE-HCTZ 75-50 MG PO TABS: 1.0000 | ORAL_TABLET | Freq: Every day | ORAL | 1 refills | Status: DC

## 2016-12-04 MED ORDER — FUROSEMIDE 20 MG PO TABS: 20.0000 mg | ORAL_TABLET | Freq: Every day | ORAL | 0 refills | Status: DC | PRN

## 2016-12-04 NOTE — Plan of Care (Signed)
Problem: Coping: Goal: Ability to cope will improve Outcome: Progressing I talked to patient about her emotional distressors and resources available (spiritual care) to help her with coping

## 2016-12-04 NOTE — Lactation Note (Signed)
This note was copied from a baby's chart. Lactation Consultation Note  Patient Name: Penny Duffy WUJWJ'XToday's Date: 12/04/2016  Pecola LeisureBaby has been transferred to NICU and mom set up with symphony pump.  [facility]  Instructed to pump 8-12 times in 24 hours.  Plan to follow up tomorrow.   Maternal Data    Feeding Feeding Type: Formula Nipple Type: Slow - flow Length of feed: 20 min  LATCH Score/Interventions                      Lactation Tools Discussed/Used     Consult Status      Huston FoleyMOULDEN, Cas Tracz S 12/04/2016, 3:53 PM

## 2016-12-04 NOTE — Clinical Social Work Maternal (Signed)
CLINICAL SOCIAL WORK MATERNAL/CHILD NOTE  Patient Details  Name: Penny Duffy MRN: [id number] Date of Birth: 05/30/1981  Date:  12/04/2016  Clinical Social Worker Initiating Note:  Floree Zuniga, LCSW Date/ Time Initiated:  12/04/16/1100     Child's Name:  Kennedy Light   Legal Guardian:  Other (Comment) (Parents: Coralie Geffre and Alex Light)   Need for Interpreter:  None   Date of Referral:  12/03/16     Reason for Referral:  Current Substance Use/Substance Use During Pregnancy    Referral Source:  Central Nursery   Address:  [address], , [place] [address]  Phone number:  [phone]   Household Members:  Significant Other (Parents live together)   Natural Supports (not living in the home):  Friends, Immediate Family, Extended Family (Parents report having a great support system of family and friends)   Professional Supports: None   Employment:     Type of Work:     Education:      Financial Resources:  Medicaid   Other Resources:      Cultural/Religious Considerations Which May Impact Care:  None stated.  MOB's facesheet notes religion as [employer].  Strengths:  Ability to meet basic needs , Pediatrician chosen , Understanding of illness, Home prepared for child , Compliance with medical plan  ([facility])   Risk Factors/Current Problems:  None   Cognitive State:  Able to Concentrate , Alert , Linear Thinking , Insightful , Goal Oriented    Mood/Affect:  Anxious , Interested , Comfortable    CSW Assessment: CSW met with parents in MOB's first floor room/138 to offer support, introduce services, and complete assessment due to hx of marijuana use (MOB positive on 07/02/16) and baby's transfer to NICU.  CSW had attempted twice yesterday to meet with parents, but they had numerous visitors.  Parents were very pleasant and welcoming of CSW's visit.  CSW found them to be easy to engage, but somewhat anxious by report and evidenced by FOB's  mannerisms.   CSW provided supportive brief counseling and active listening while parents begin to process their feelings.  Parents acknowledge sadness and feelings of uncertainty regarding baby's transfer to NICU.  They appear to have a good understanding of baby's medical needs at this time and feel they have been updated appropriately by staff.  They state a thankfulness that baby will be well evaluated prior to discharge, even if it means separation from baby and a delayed discharge.  FOB looked very nervous and nearly tearful when he asked CSW "how often do you see something like this and what chances does she have?"  CSW normalized and validated FOB's question and explained that an MD or NNP would be the best person to answer this question.  FOB understands that the future cannot be predicted, but is looking to have a conversation in order to "put me at ease."  CSW spoke to NNP to request that she meet with parents today.  Parents were very appreciative. Parents report having great supports and everything they need for baby at home.  CSW provided information related to SIDS precautions and the dangers of co-sleeping.  Parents report that baby will sleep in a pack and play in their room before transitioning to a crib in her own room at some point.  They were very attentive to information given and asked appropriate questions.  FOB reports that he smokes outside and understands the recommendation to change clothes and wash his hands prior to holding   baby after smoking.  MOB reports that she smokes "socially" and does not feel it is an addiction.  She reports that she stopped use of all substances, which had included cigarettes, marijuana and alcohol (all used "socially") as soon as she found out she was pregnant.  CSW informed parents of hospital drug screen policy and mandated reporting, which parents understand and state no concerns.  FOB admits to smoking marijuana in the past, but denies recent use.   Baby's UDS is negative.  CSW will monitor CDS result. CSW discussed the importance of monitoring maternal mental health and provided education regarding PMADs.  CSW encouraged parents to contact a medical professional if they have concerns about their emotions at any time.  Parents were attentive to this information as well and seemed appreciative of CSW's concern for their emotional wellbeing.  MOB reports no hx of mental illness and states that she has felt well emotionally throughout her pregnancy.  CSW offered condolences regarding the loss of her Chihuahua last week and acknowledged this as a significant loss, with the encouragement to allow herself to grieve.  MOB was appreciative.   CSW explained ongoing support services offered by NICU CSW and gave contact information.    CSW Plan/Description:  Information/Referral to Community Resources , Patient/Family Education , Psychosocial Support and Ongoing Assessment of Needs    Noach Calvillo Elizabeth, LCSW 12/04/2016, 1:09 PM 

## 2016-12-04 NOTE — Discharge Summary (Signed)
OB Discharge Summary     Patient Name: Penny Duffy DOB: March 05, 1981 MRN: [id number]  Date of admission: 11/30/2016 Delivering MD: Shonna ChockWOUK, NOAH BEDFORD   Date of discharge: 12/04/2016  Admitting diagnosis: induction Intrauterine pregnancy: 2238w2d     Secondary diagnosis:  Active Problems:   Gestational hypertension, third trimester   SVD (spontaneous vaginal delivery)  Additional problems: mild pre-e, triple I     Discharge diagnosis: Term Pregnancy Delivered                                                                                                Post partum procedures:none  Augmentation: AROM, Pitocin, Cytotec and Foley Balloon  Complications: Intrauterine Inflammation or infection (Chorioamniotis)  Hospital course:  Induction of Labor With Vaginal Delivery   36 y.o. yo G1P1001 at 5638w2d was admitted to the hospital 11/30/2016 for induction of labor.  Indication for induction: Gestational hypertension.  Patient had a labor course as follows: Membrane Rupture Time/Date: 11:25 PM ,12/01/2016   Intrapartum Procedures: Episiotomy: None [1]                                         Lacerations:  1st degree [2]  Patient had delivery of a Viable infant.  Information for the patient's newborn:  Omar PersonBurroughs, Girl French Anaracy [[id number]][[id number]]  Delivery Method: Vag-Spont   12/02/2016  Details of delivery can be found in separate delivery note. She received Amp and Gent x 24h PP. Patient otherwise had a routine postpartum course. Patient is discharged home 12/04/16. She was given Maxzide at d/c due to BLE edema.  Physical exam Vitals:   12/03/16 0035 12/03/16 0541 12/03/16 1815 12/04/16 0700  BP: 122/79 (!) 110/50 132/80 127/75  Pulse: 82 74 77 71  Resp: 18 18 18 18   Temp: 98.2 F (36.8 C) 98.2 F (36.8 C) 97.6 F (36.4 C) 97.9 F (36.6 C)  TempSrc: Oral Oral Oral Oral  SpO2:      Weight:      Height:       General: alert and cooperative Lochia: appropriate Uterine Fundus:  firm Incision: N/A DVT Evaluation: No evidence of DVT seen on physical exam. Calf/Ankle edema is present- pitting to knees  Labs: Lab Results  Component Value Date   WBC 20.1 (H) 12/01/2016   HGB 12.8 12/01/2016   HCT 37.0 12/01/2016   MCV 86.9 12/01/2016   PLT 120 (L) 12/01/2016   CMP Latest Ref Rng & Units 12/01/2016  Glucose 65 - 99 mg/dL 782(N111(H)  BUN 6 - 20 mg/dL 7  Creatinine 5.620.44 - 1.301.00 mg/dL 8.650.61  Sodium 784135 - 696145 mmol/L 133(L)  Potassium 3.5 - 5.1 mmol/L 3.5  Chloride 101 - 111 mmol/L 102  CO2 22 - 32 mmol/L 22  Calcium 8.9 - 10.3 mg/dL 2.9(B8.7(L)  Total Protein 6.5 - 8.1 g/dL 6.0(L)  Total Bilirubin 0.3 - 1.2 mg/dL 0.6  Alkaline Phos 38 - 126 U/L 141(H)  AST 15 - 41 U/L 20  ALT 14 - 54 U/L  16    Discharge instruction: per After Visit Summary and "Baby and Me Booklet".  After visit meds:  Allergies as of 12/04/2016   No Known Allergies     Medication List    TAKE these medications   acetaminophen 325 MG tablet Commonly known as:  TYLENOL Take 2 tablets (650 mg total) by mouth every 4 (four) hours as needed (for pain scale < 4).   ibuprofen 600 MG tablet Commonly known as:  ADVIL,MOTRIN Take 1 tablet (600 mg total) by mouth every 6 (six) hours.   multivitamin-prenatal 27-0.8 MG Tabs tablet Take 1 tablet by mouth daily at 12 noon.   triamterene-hydrochlorothiazide 75-50 MG tablet Commonly known as:  MAXZIDE Take 1 tablet by mouth daily.       Diet: low salt diet  Activity: Advance as tolerated. Pelvic rest for 6 weeks.   Outpatient follow up:Will have Baby Love BP check within the week, and then a 4wk PP visit. Follow up Appt:Future Appointments Date Time Provider Department Center  01/13/2017 2:40 PM Marylene Land, [place] [facility]   Follow up Visit:No Follow-up on file.  Postpartum contraception: Combination OCPs  Newborn Data: Live born female  Birth Weight: 7 lb 1.4 oz (3215 g) APGAR: 9, 9  Baby Feeding:  Bottle Disposition:NICU   12/04/2016 Cordie Buening, Cala Bradford, CNM  10:12 AM

## 2016-12-05 ENCOUNTER — Ambulatory Visit: Payer: Self-pay

## 2016-12-05 NOTE — Lactation Note (Signed)
This note was copied from a baby's chart. Lactation Consultation Note  Patient Name: Penny Duffy UJWJX'BToday's Date: 12/05/2016  Follow up visit made in NICU.  Mom would like assist with pumping.  Symphony pump set up at bedside.  Flange size changed to 21 mm for a better fit.  Instructed on pump use and cleaning.  Mom pumped for 15 minutes on initiation setting.  One drop visible on nipple and given to baby.  Assisted mom with hand expression and 2 more drops obtained.  Reassured mom and instructed to pump 8-12 times/24 hours.  Encouraged to call with assist/concerns   Maternal Data    Feeding Feeding Type: Formula Nipple Type: Slow - flow Length of feed: 10 min  LATCH Score/Interventions                      Lactation Tools Discussed/Used     Consult Status      Huston FoleyMOULDEN, Autumnrose Yore S 12/05/2016, 2:32 PM

## 2016-12-10 ENCOUNTER — Encounter: Payer: Medicaid Other | Admitting: Advanced Practice Midwife

## 2016-12-17 ENCOUNTER — Encounter: Payer: Medicaid Other | Admitting: Obstetrics and Gynecology

## 2016-12-24 ENCOUNTER — Encounter: Payer: Medicaid Other | Admitting: Obstetrics and Gynecology

## 2017-01-13 ENCOUNTER — Ambulatory Visit: Payer: Self-pay | Admitting: Student

## 2017-08-17 ENCOUNTER — Other Ambulatory Visit (HOSPITAL_COMMUNITY)
Admission: RE | Admit: 2017-08-17 | Discharge: 2017-08-17 | Disposition: A | Discharge status: Home / Self Care | Payer: Medicaid Other | Source: Ambulatory Visit | Attending: Medical | Admitting: Medical

## 2017-08-17 ENCOUNTER — Ambulatory Visit (INDEPENDENT_AMBULATORY_CARE_PROVIDER_SITE_OTHER): Payer: Medicaid Other | Admitting: Medical

## 2017-08-17 ENCOUNTER — Encounter: Payer: Self-pay | Admitting: Medical

## 2017-08-17 VITALS — BP 134/73 | HR 77 | Wt 155.0 lb

## 2017-08-17 DIAGNOSIS — Z309 Encounter for contraceptive management, unspecified: Secondary | ICD-10-CM

## 2017-08-17 DIAGNOSIS — Z0000 Encounter for general adult medical examination without abnormal findings: Secondary | ICD-10-CM | POA: Insufficient documentation

## 2017-08-17 DIAGNOSIS — N911 Secondary amenorrhea: Secondary | ICD-10-CM

## 2017-08-17 DIAGNOSIS — Z8759 Personal history of other complications of pregnancy, childbirth and the puerperium: Secondary | ICD-10-CM

## 2017-08-17 LAB — POCT PREGNANCY, URINE: PREG TEST UR: NEGATIVE

## 2017-08-17 NOTE — Patient Instructions (Signed)
Secondary Amenorrhea °Secondary amenorrhea is the stopping of menstrual flow for 3-6 months in a female who has previously had periods. There are many possible causes. Most of these causes are not serious. Usually, treating the underlying problem causing the loss of menses will return your periods to normal. °What are the causes? °Some common and uncommon causes of not menstruating include: °· Malnutrition. °· Low blood sugar (hypoglycemia). °· Polycystic ovary disease. °· Stress or fear. °· Breastfeeding. °· Hormone imbalance. °· Ovarian failure. °· Medicines. °· Extreme obesity. °· Cystic fibrosis. °· Low body weight or drastic weight reduction from any cause. °· Early menopause. °· Removal of ovaries or uterus. °· Contraceptives. °· Illness. °· Long-term (chronic) illnesses. °· Cushing syndrome. °· Thyroid problems. °· Birth control pills, patches, or vaginal rings for birth control. ° °What increases the risk? °You may be at greater risk of secondary amenorrhea if: °· You have a family history of this condition. °· You have an eating disorder. °· You do athletic training. ° °How is this diagnosed? °A diagnosis is made by your health care provider taking a medical history and doing a physical exam. This will include a pelvic exam to check for problems with your reproductive organs. Pregnancy must be ruled out. Often, numerous blood tests are done to measure different hormones in the body. Urine testing may be done. Specialized exams (ultrasound, CT scan, MRI, or hysteroscopy) may have to be done as well as measuring the body mass index (BMI). °How is this treated? °Treatment depends on the cause of the amenorrhea. If an eating disorder is present, this can be treated with an adequate diet and therapy. Chronic illnesses may improve with treatment of the illness. Amenorrhea may be corrected with medicines, lifestyle changes, or surgery. If the amenorrhea cannot be corrected, it is sometimes possible to create a  false menstruation with medicines. °Follow these instructions at home: °· Maintain a healthy diet. °· Manage weight problems. °· Exercise regularly but not excessively. °· Get adequate sleep. °· Manage stress. °· Be aware of changes in your menstrual cycle. Keep a record of when your periods occur. Note the date your period starts, how long it lasts, and any problems. °Contact a health care provider if: °Your symptoms do not get better with treatment. °This information is not intended to replace advice given to you by your health care provider. Make sure you discuss any questions you have with your health care provider. °Document Released: 12/22/2006 Document Revised: 04/17/2016 Document Reviewed: 04/28/2013 °Elsevier Interactive Patient Education © 2018 [employer] °Hypertension °Hypertension is another name for high blood pressure. High blood pressure forces your heart to work harder to pump blood. This can cause problems over time. °There are two numbers in a blood pressure reading. There is a top number (systolic) over a bottom number (diastolic). It is best to have a blood pressure below 120/80. Healthy choices can help lower your blood pressure. You may need medicine to help lower your blood pressure if: °· Your blood pressure cannot be lowered with healthy choices. °· Your blood pressure is higher than 130/80. ° °Follow these instructions at home: °Eating and drinking °· If directed, follow the DASH eating plan. This diet includes: °? Filling half of your plate at each meal with fruits and vegetables. °? Filling one quarter of your plate at each meal with whole grains. Whole grains include whole wheat pasta, brown rice, and whole grain bread. °? Eating or drinking low-fat dairy products, such as skim milk   or low-fat yogurt. °? Filling one quarter of your plate at each meal with low-fat (lean) proteins. Low-fat proteins include fish, skinless chicken, eggs, beans, and tofu. °? Avoiding fatty meat, cured and  processed meat, or chicken with skin. °? Avoiding premade or processed food. °· Eat less than 1,500 mg of salt (sodium) a day. °· Limit alcohol use to no more than 1 drink a day for nonpregnant women and 2 drinks a day for men. One drink equals 12 oz of beer, 5 oz of wine, or 1½ oz of hard liquor. °Lifestyle °· Work with your doctor to stay at a healthy weight or to lose weight. Ask your doctor what the best weight is for you. °· Get at least 30 minutes of exercise that causes your heart to beat faster (aerobic exercise) most days of the week. This may include walking, swimming, or biking. °· Get at least 30 minutes of exercise that strengthens your muscles (resistance exercise) at least 3 days a week. This may include lifting weights or pilates. °· Do not use any products that contain nicotine or tobacco. This includes cigarettes and e-cigarettes. If you need help quitting, ask your doctor. °· Check your blood pressure at home as told by your doctor. °· Keep all follow-up visits as told by your doctor. This is important. °Medicines °· Take over-the-counter and prescription medicines only as told by your doctor. Follow directions carefully. °· Do not skip doses of blood pressure medicine. The medicine does not work as well if you skip doses. Skipping doses also puts you at risk for problems. °· Ask your doctor about side effects or reactions to medicines that you should watch for. °Contact a doctor if: °· You think you are having a reaction to the medicine you are taking. °· You have headaches that keep coming back (recurring). °· You feel dizzy. °· You have swelling in your ankles. °· You have trouble with your vision. °Get help right away if: °· You get a very bad headache. °· You start to feel confused. °· You feel weak or numb. °· You feel faint. °· You get very bad pain in your: °? Chest. °? Belly (abdomen). °· You throw up (vomit) more than once. °· You have trouble breathing. °Summary °· Hypertension is  another name for high blood pressure. °· Making healthy choices can help lower blood pressure. If your blood pressure cannot be controlled with healthy choices, you may need to take medicine. °This information is not intended to replace advice given to you by your health care provider. Make sure you discuss any questions you have with your health care provider. °Document Released: 04/28/2008 Document Revised: 10/08/2016 Document Reviewed: 10/08/2016 °Elsevier Interactive Patient Education © 2018 Elsevier Inc. ° °

## 2017-08-17 NOTE — Progress Notes (Signed)
Subjective:    Penny Duffy is a 36 y.o. female who presents for an annual exam. The patient is concerned that she has not had a period since the birth of her child 8 months ago. She states that she was breastfeeding for the first 2 months, but is no longer breastfeeding or lactating. The patient is sexually active. GYN screening history: last pap: approximate date 2016 and was normal, we do not have record of this pap smear. The patient wears seatbelts: yes. The patient participates in regular exercise: not asked. Has the patient ever been transfused or tattooed?: not asked. The patient reports that there is not domestic violence in her life.   Menstrual History: OB History    Gravida Para Term Preterm AB Living   SAB TAB Ectopic Multiple Live Births         0 1      No LMP recorded.    The following portions of the patient's history were reviewed and updated as appropriate: allergies, current medications, past family history, past medical history, past social history, past surgical history and problem list.  Review of Systems Pertinent items are noted in HPI.    Objective:     Physical Exam  Nursing note and vitals reviewed. Constitutional: She is oriented to person, place, and time. She appears well-developed and well-nourished. No distress.  HENT:  Head: Normocephalic and atraumatic.  Cardiovascular: Normal rate, regular rhythm and normal heart sounds.   Respiratory: Effort normal and breath sounds normal. No respiratory distress. She has no wheezes.  GI: Soft. Bowel sounds are normal. She exhibits no distension and no mass. There is no tenderness. There is no rebound and no guarding.  Genitourinary: There is no rash on the right labia. There is no rash on the left labia. Uterus is not enlarged and not tender. Cervix exhibits no motion tenderness, no discharge and no friability. Right adnexum displays no mass and no tenderness. Left adnexum displays no mass and no  tenderness. No erythema or bleeding in the vagina. No vaginal discharge found.  Neurological: She is alert and oriented to person, place, and time.  Skin: Skin is warm and dry. No erythema.  Psychiatric: She has a normal mood and affect.   .    Results for orders placed or performed in visit on 08/17/17 (from the past 24 hour(s))  Pregnancy, urine POC     Status: None   Collection Time: 08/17/17  6:30 PM  Result Value Ref Range   Preg Test, Ur NEGATIVE NEGATIVE      Assessment:    Healthy female exam.   Amenorrhea, secondary   Borderline blood pressure Negative pregnancy test  Plan:     Await pap smear results.   TSH, FSH, Prolactin and HgbA1c drawn today If labs are normal, will await 1 year PP and then discuss birth control for protective measures, patient does not desire birth control at this time Follow-up in 4 months if still amenorrheic or 1 year for annual exam or sooner PRN  Kathlene Cote 08/17/2017 6:57 PM

## 2017-08-17 NOTE — Progress Notes (Signed)
No period in 8 months Discuss birth control

## 2017-08-18 LAB — TSH: TSH: <0.006 u[IU]/mL — ABNORMAL LOW (ref 0.450–4.500)

## 2017-08-18 LAB — HEMOGLOBIN A1C
ESTIMATED AVERAGE GLUCOSE: 94 mg/dL
Hgb A1c MFr Bld: 4.9 % (ref 4.8–5.6)

## 2017-08-18 LAB — PROLACTIN: PROLACTIN: 19 ng/mL (ref 4.8–23.3)

## 2017-08-18 LAB — FOLLICLE STIMULATING HORMONE: FSH: 6.6 m[IU]/mL

## 2017-08-19 LAB — CYTOLOGY - PAP
DIAGNOSIS: NEGATIVE
HPV: NOT DETECTED

## 2017-08-24 ENCOUNTER — Telehealth: Payer: Self-pay | Admitting: General Practice

## 2017-08-24 DIAGNOSIS — E0590 Thyrotoxicosis, unspecified without thyrotoxic crisis or storm: Secondary | ICD-10-CM

## 2017-08-24 NOTE — Telephone Encounter (Signed)
-----   Message from Marny Lowenstein, PA-C sent at 08/24/2017 12:12 PM EDT ----- Please inform patient that thyroid hormone was abnormal and could account for irregularities in her cycles since the birth of her child. She will need to see PCP or Endocrinology for management ASAP. Can we please set up?   Thanks,  Raynelle Fanning

## 2017-08-24 NOTE — Telephone Encounter (Signed)
Called patient and informed her of results and recommendation. Patient verbalized understanding and states she does not have a PCP. Told patient we will refer her to an endocrinologist and asked if she has insurance. Patient states she does not. Told patient I will call a few offices in the area and see what will be more cost effective for her and will call her back. Patient verbalized understanding and had no questions  Called [facility], who will take patients without insurance for $80 at initial visit. Referral placed and they will work from queue. Called patient and informed her. Patient verbalized understanding & will await phone call. Patient had no questions

## 2017-09-09 LAB — T3, FREE: T3 FREE: 15.6 pg/mL — AB (ref 2.0–4.4)

## 2017-09-09 LAB — T4, FREE: FREE T4: 4.88 ng/dL — AB (ref 0.82–1.77)

## 2017-09-09 LAB — SPECIMEN STATUS REPORT

## 2017-09-28 ENCOUNTER — Telehealth: Payer: Self-pay | Admitting: *Deleted

## 2017-09-28 ENCOUNTER — Other Ambulatory Visit: Payer: Self-pay

## 2017-09-28 ENCOUNTER — Ambulatory Visit (INDEPENDENT_AMBULATORY_CARE_PROVIDER_SITE_OTHER): Payer: Self-pay | Admitting: Endocrinology

## 2017-09-28 ENCOUNTER — Encounter: Payer: Self-pay | Admitting: Endocrinology

## 2017-09-28 DIAGNOSIS — E0590 Thyrotoxicosis, unspecified without thyrotoxic crisis or storm: Secondary | ICD-10-CM | POA: Insufficient documentation

## 2017-09-28 LAB — T4, FREE: FREE T4: 4.12 ng/dL — AB (ref 0.60–1.60)

## 2017-09-28 LAB — TSH

## 2017-09-28 MED ORDER — METHIMAZOLE 10 MG PO TABS: 40.0000 mg | ORAL_TABLET | Freq: Two times a day (BID) | ORAL | 2 refills | Status: DC

## 2017-09-28 NOTE — Progress Notes (Signed)
Subjective:    Patient ID: Penny Duffy, female    DOB: January 13, 1981, 36 y.o.   MRN: [id number]  HPI Pt is referred by Vonzella NippleJulie Wenzel, NP, for hyperthyroidism.  Pt reports he was dx'ed with hyperthyroidism 6 weeks ago (TSH was normal in 2017).  she has never been on therapy for this.  she has never had XRT to the anterior neck, or thyroid surgery.  she has never had thyroid imaging.  she does not consume kelp or any non-prescribed thyroid medication.  she has never been on amiodarone.  She is 10 months postpartum. She has moderate heat intolerance, and assoc diffuse swelling. She is no longer breast feeding.   Past Medical History:  Diagnosis Date  . Medical history non-contributory   . Pregnancy induced hypertension     Past Surgical History:  Procedure Laterality Date  . NO PAST SURGERIES      Social History   Socioeconomic History  . Marital status: Single    Spouse name: Not on file  . Number of children: Not on file  . Years of education: Not on file  . Highest education level: Not on file  Social Needs  . Financial resource strain: Not on file  . Food insecurity - worry: Not on file  . Food insecurity - inability: Not on file  . Transportation needs - medical: Not on file  . Transportation needs - non-medical: Not on file  Occupational History  . Not on file  Tobacco Use  . Smoking status: Former Smoker    Types: Cigarettes  . Smokeless tobacco: Former Engineer, waterUser  Substance and Sexual Activity  . Alcohol use: No  . Drug use: No  . Sexual activity: Yes    Birth control/protection: None  Other Topics Concern  . Not on file  Social History Narrative  . Not on file    Current Outpatient Medications on File Prior to Visit  Medication Sig Dispense Refill  . acetaminophen (TYLENOL) 325 MG tablet Take 2 tablets (650 mg total) by mouth every 4 (four) hours as needed (for pain scale < 4). (Patient not taking: Reported on 08/17/2017) 30 tablet 0  . furosemide (LASIX) 20 MG  tablet Take 1 tablet (20 mg total) by mouth daily as needed for edema. (Patient not taking: Reported on 09/28/2017) 3 tablet 0  . ibuprofen (ADVIL,MOTRIN) 600 MG tablet Take 1 tablet (600 mg total) by mouth every 6 (six) hours. (Patient not taking: Reported on 08/17/2017) 30 tablet 0  . Prenatal Vit-Fe Fumarate-FA (MULTIVITAMIN-PRENATAL) 27-0.8 MG TABS tablet Take 1 tablet by mouth daily at 12 noon.     . triamterene-hydrochlorothiazide (MAXZIDE) 75-50 MG tablet Take 1 tablet by mouth daily. (Patient not taking: Reported on 08/17/2017) 30 tablet 1   No current facility-administered medications on file prior to visit.     No Known Allergies  Family History  Problem Relation Age of Onset  . Thyroid disease Mother     BP 124/82 (BP Location: Left Arm, Patient Position: Sitting, Cuff Size: Normal)   Pulse 100   Wt 153 lb 3.2 oz (69.5 kg)   SpO2 98%   BMI 28.02 kg/m    Review of Systems denies fever, hoarseness, visual loss, diarrhea, polyuria, muscle weakness, easy bruising, and rhinorrhea. She is unable to lose pregnancy weight.  She has fatigue, doe, excessive diaphoresis, tremor, anxiety, palpitations, and headache.       Objective:   Physical Exam VS: see vs page GEN: no distress HEAD: head: no  deformity eyes: no periorbital swelling; moderate bilat proptosis external nose and ears are normal mouth: no lesion seen NECK: bilat 1 cm thyroid nodules, vs small diffuse goiter CHEST WALL: no deformity LUNGS: clear to auscultation CV: reg rate and rhythm, no murmur ABD: abdomen is soft, nontender.  no hepatosplenomegaly.  not distended.  no hernia MUSCULOSKELETAL: muscle bulk and strength are grossly normal.  no obvious joint swelling.  gait is normal and steady EXTEMITIES: no deformity.  no ulcer on the feet.  feet are of normal color and temp.  no edema PULSES: dorsalis pedis intact bilat.  no carotid bruit NEURO:  cn 2-12 grossly intact.   readily moves all 4's.  sensation is  intact to touch on the feet. Slight tremor of the hands SKIN:  Normal texture and temperature.  No rash or suspicious lesion is visible.  Slightly diaphoretic NODES:  None palpable at the neck PSYCH: alert, well-oriented.  Does not appear anxious nor depressed.   Lab Results  Component Value Date   TSH <0.01 Repeated and verified X2. (L) 09/28/2017   I have reviewed outside records, and summarized: Pt was noted to have suppressed TSH, and referred here.  She was seen for well woman exam.  She had irreg menses.      Assessment & Plan:  Hyperthyroidism, new, prob due to Grave's Dz.      Patient Instructions  blood tests are requested for you today.  We'll let you know about the results. Based on the results, I will likely send a prescription to your pharmacy, to slow the thyroid. If ever you have fever while taking methimazole, stop it and call us, even if the reason is obvious, because of the risk of a rare side-effect Please come back for a follow-up appointment in 3-4 weeks.         Hyperthyroidism Hyperthyroidism is when the thyroid is too active (overactive). Your thyroid is a large gland that is located in your neck. The thyroid helps to control how your body uses food (metabolism). When your thyroid is overactive, it produces too much of a hormone called thyroxine. What are the causes? Causes of hyperthyroidism may include:  Graves disease. This is when your immune system attacks the thyroid gland. This is the most common cause.  Inflammation of the thyroid gland.  Tumor in the thyroid gland or somewhere else.  Excessive use of thyroid medicines, including: ? Prescription thyroid supplement. ? Herbal supplements that mimic thyroid hormones.  Solid or fluid-filled lumps within your thyroid gland (thyroid nodules).  Excessive ingestion of iodine.  What increases the risk?  Being female.  Having a family history of thyroid conditions. What are the signs or  symptoms? Signs and symptoms of hyperthyroidism may include:  Nervousness.  Inability to tolerate heat.  Unexplained weight loss.  Diarrhea.  Change in the texture of hair or skin.  Heart skipping beats or making extra beats.  Rapid heart rate.  Loss of menstruation.  Shaky hands.  Fatigue.  Restlessness.  Increased appetite.  Sleep problems.  Enlarged thyroid gland or nodules.  How is this diagnosed? Diagnosis of hyperthyroidism may include:  Medical history and physical exam.  Blood tests.  Ultrasound tests.  How is this treated? Treatment may include:  Medicines to control your thyroid.  Surgery to remove your thyroid.  Radiation therapy.  Follow these instructions at home:  Take medicines only as directed by your health care provider.  Do not use any tobacco products, including cigarettes, chewing  tobacco, or electronic cigarettes. If you need help quitting, ask your health care provider.  Do not exercise or do physical activity until your health care provider approves.  Keep all follow-up appointments as directed by your health care provider. This is important. Contact a health care provider if:  Your symptoms do not get better with treatment.  You have fever.  You are taking thyroid replacement medicine and you: ? Have depression. ? Feel mentally and physically slow. ? Have weight gain. Get help right away if:  You have decreased alertness or a change in your awareness.  You have abdominal pain.  You feel dizzy.  You have a rapid heartbeat.  You have an irregular heartbeat. This information is not intended to replace advice given to you by your health care provider. Make sure you discuss any questions you have with your health care provider. Document Released: 11/10/2005 Document Revised: 04/10/2016 Document Reviewed: 03/28/2014 Elsevier Interactive Patient Education  2017 [employer].

## 2017-09-28 NOTE — Telephone Encounter (Signed)
Patient called-please make sure that Thyroid medication is [facility], [address]

## 2017-09-28 NOTE — Telephone Encounter (Signed)
I called and left patient detailed VM that we were waiting on lab results. As soon as Dr. Everardo AllEllison received those that he would send in correct dosage of mediation to pharmacy.

## 2017-09-28 NOTE — Patient Instructions (Addendum)
blood tests are requested for you today.  We'll let you know about the results. Based on the results, I will likely send a prescription to your pharmacy, to slow the thyroid. If ever you have fever while taking methimazole, stop it and call us, even if the reason is obvious, because of the risk of a rare side-effect Please come back for a follow-up appointment in 3-4 weeks.         Hyperthyroidism Hyperthyroidism is when the thyroid is too active (overactive). Your thyroid is a large gland that is located in your neck. The thyroid helps to control how your body uses food (metabolism). When your thyroid is overactive, it produces too much of a hormone called thyroxine. What are the causes? Causes of hyperthyroidism may include:  Graves disease. This is when your immune system attacks the thyroid gland. This is the most common cause.  Inflammation of the thyroid gland.  Tumor in the thyroid gland or somewhere else.  Excessive use of thyroid medicines, including: ? Prescription thyroid supplement. ? Herbal supplements that mimic thyroid hormones.  Solid or fluid-filled lumps within your thyroid gland (thyroid nodules).  Excessive ingestion of iodine.  What increases the risk?  Being female.  Having a family history of thyroid conditions. What are the signs or symptoms? Signs and symptoms of hyperthyroidism may include:  Nervousness.  Inability to tolerate heat.  Unexplained weight loss.  Diarrhea.  Change in the texture of hair or skin.  Heart skipping beats or making extra beats.  Rapid heart rate.  Loss of menstruation.  Shaky hands.  Fatigue.  Restlessness.  Increased appetite.  Sleep problems.  Enlarged thyroid gland or nodules.  How is this diagnosed? Diagnosis of hyperthyroidism may include:  Medical history and physical exam.  Blood tests.  Ultrasound tests.  How is this treated? Treatment may include:  Medicines to control your  thyroid.  Surgery to remove your thyroid.  Radiation therapy.  Follow these instructions at home:  Take medicines only as directed by your health care provider.  Do not use any tobacco products, including cigarettes, chewing tobacco, or electronic cigarettes. If you need help quitting, ask your health care provider.  Do not exercise or do physical activity until your health care provider approves.  Keep all follow-up appointments as directed by your health care provider. This is important. Contact a health care provider if:  Your symptoms do not get better with treatment.  You have fever.  You are taking thyroid replacement medicine and you: ? Have depression. ? Feel mentally and physically slow. ? Have weight gain. Get help right away if:  You have decreased alertness or a change in your awareness.  You have abdominal pain.  You feel dizzy.  You have a rapid heartbeat.  You have an irregular heartbeat. This information is not intended to replace advice given to you by your health care provider. Make sure you discuss any questions you have with your health care provider. Document Released: 11/10/2005 Document Revised: 04/10/2016 Document Reviewed: 03/28/2014 Elsevier Interactive Patient Education  2017 [employer]

## 2017-09-28 NOTE — Telephone Encounter (Signed)
Patient called and states that Dr. Everardo AllEllison was suppose to call a RX in to her pharmacy today after her visit today. The medication is methimazole. [facility] [address]  Please advise. Thank you

## 2017-09-28 NOTE — Telephone Encounter (Signed)
Should this be sent in or are we waiting on labs?

## 2017-09-28 NOTE — Telephone Encounter (Signed)
We don't have test results yet--prob tonight.  The delay may be due to today's computer update.  We'll let you know.

## 2017-10-26 ENCOUNTER — Ambulatory Visit: Payer: Self-pay | Admitting: Endocrinology

## 2018-02-17 ENCOUNTER — Telehealth: Payer: Self-pay | Admitting: Endocrinology

## 2018-02-17 NOTE — Telephone Encounter (Signed)
[facility]  She states she never heard back from our office stating if she had HYPER or HYPO   She states she has seen the blood work on my chart. And would like someone to explain what exacting her blood test are stating.      Please advise

## 2018-02-17 NOTE — Telephone Encounter (Signed)
I called and spoke with patient she had since reviewed mychart. She hadn't taken the medicine consistently so she is now doing so. She will call back & schedule f/u when she has been taken medication for awhile as well as gets work schedule.

## 2018-02-24 IMAGING — US US OB TRANSVAGINAL
1 series · 15 of 28 positions shown · non-contrast
Comparison: None.

CLINICAL DATA: Positive pregnancy test.

EXAM:
OBSTETRIC <14 WK US AND TRANSVAGINAL OB US
TECHNIQUE: Both transabdominal and transvaginal ultrasound examinations were
performed for complete evaluation of the gestation as well as the
maternal uterus, adnexal regions, and pelvic cul-de-sac.
Transvaginal technique was performed to assess early pregnancy.

[Series 1: us ob transvaginal · 15 of 49 slices shown]
[im 1/49]
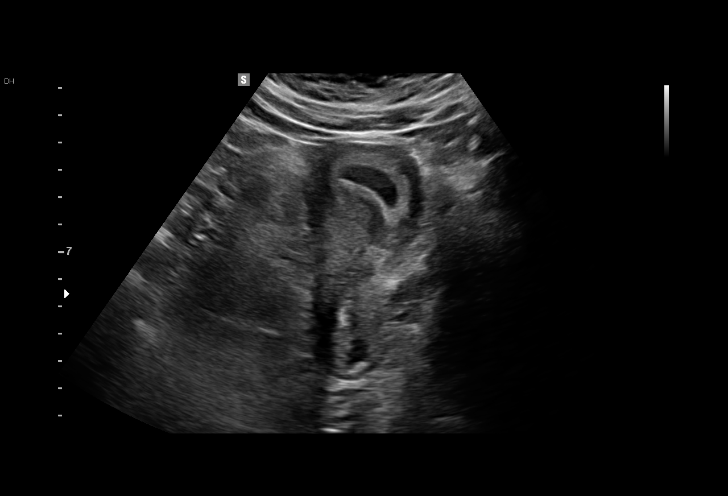
[im 4/49]
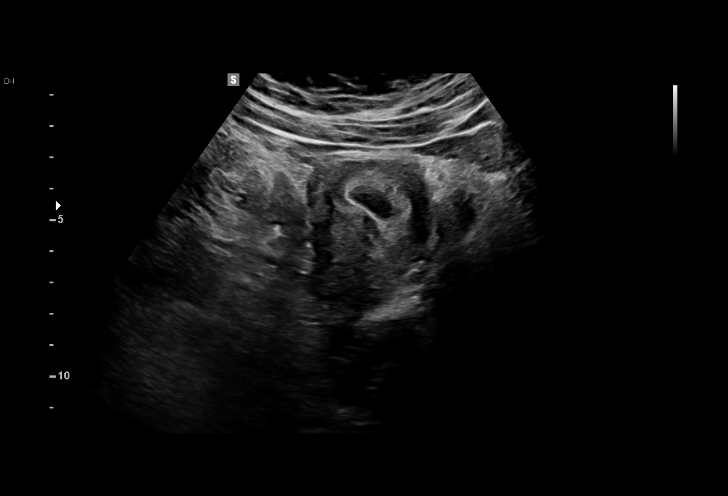
[im 8/49]
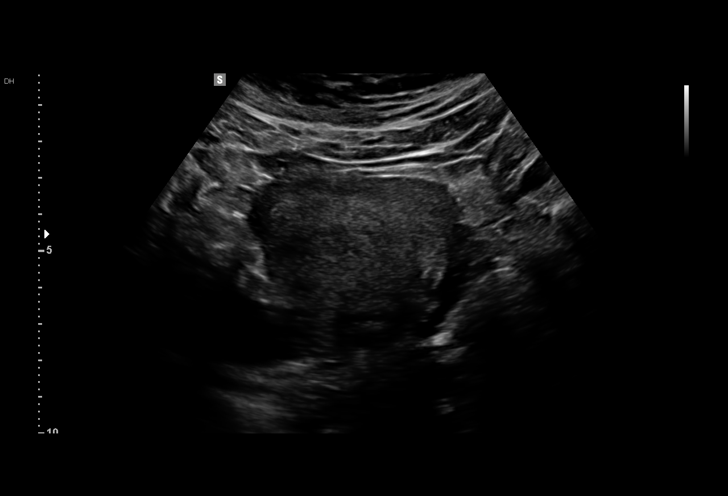
[im 11/49]
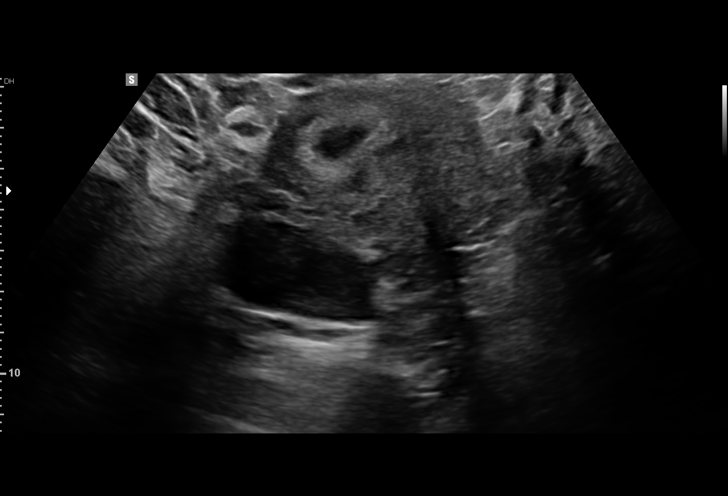
[im 15/49]
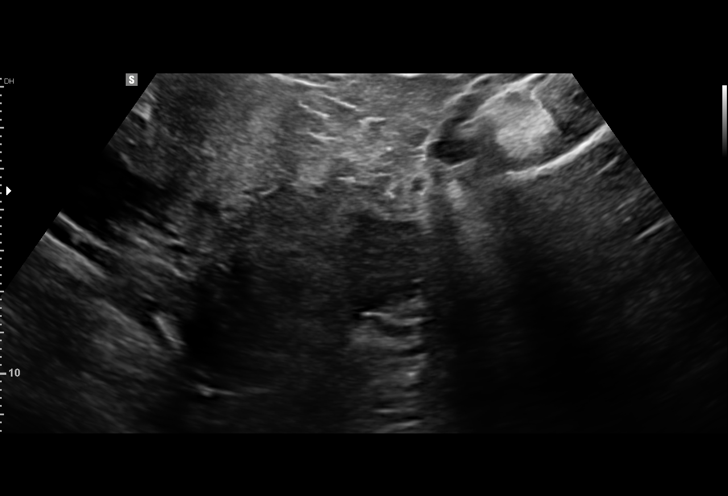
[im 18/49]
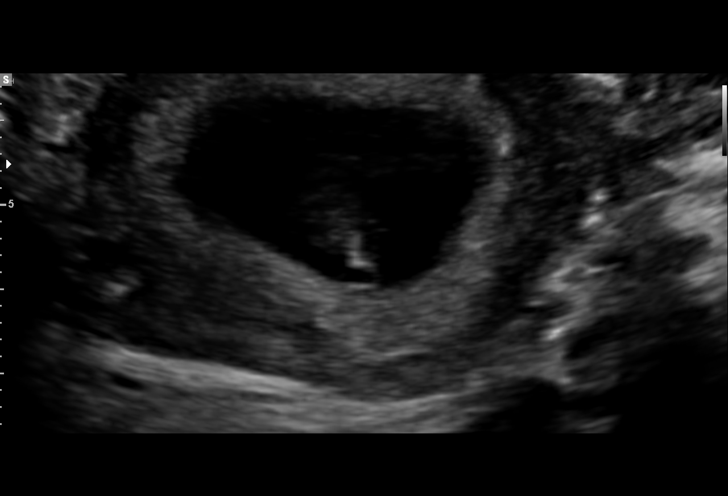
[im 22/49]
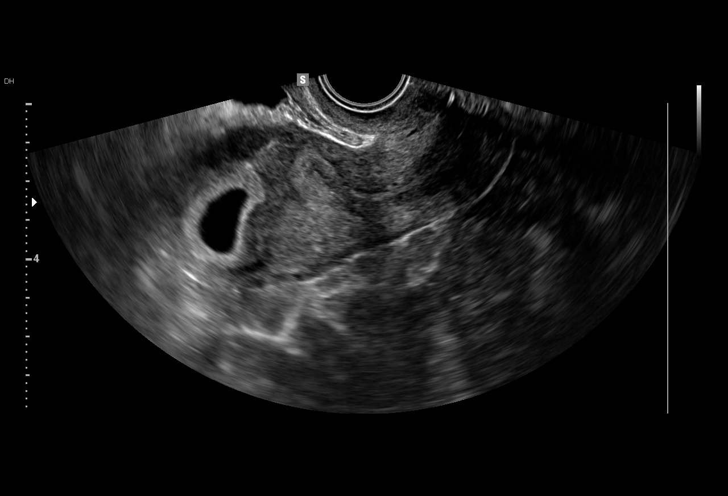
[im 25/49]
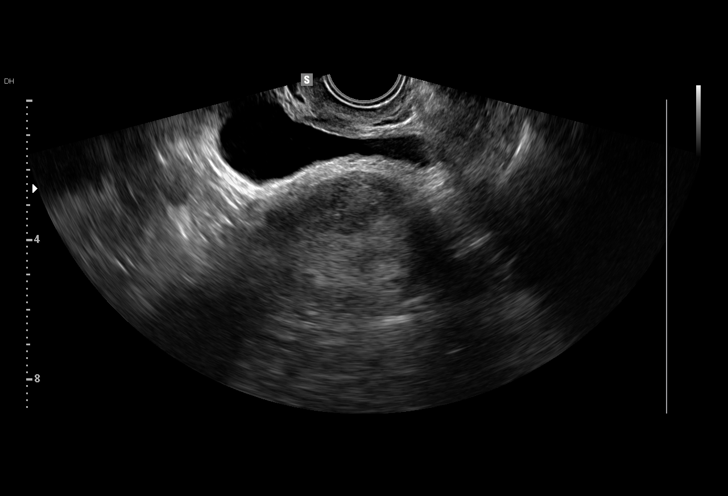
[im 27/49]
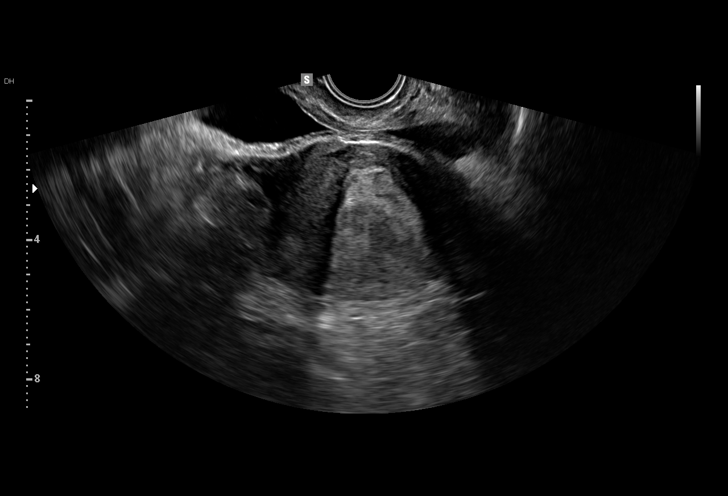
[im 31/49]
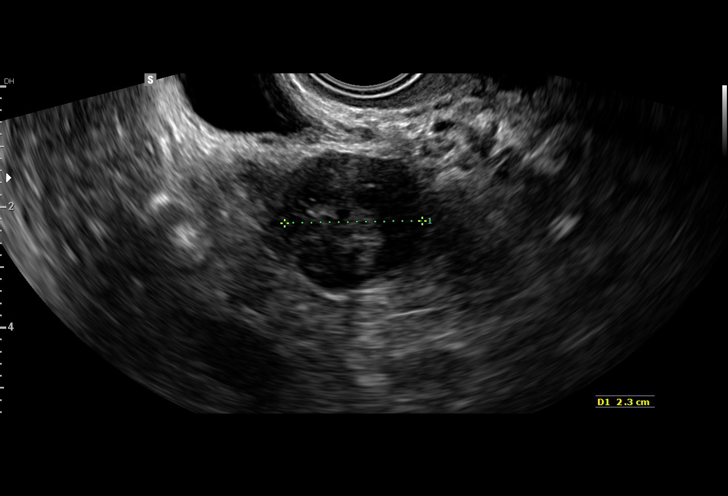
[im 34/49]
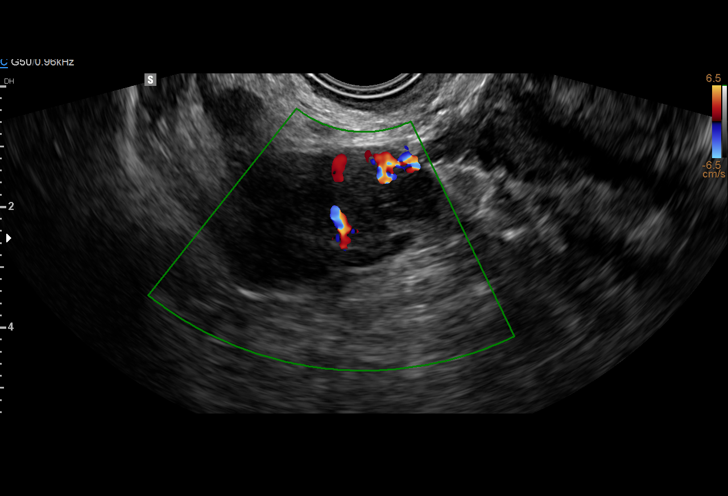
[im 38/49]
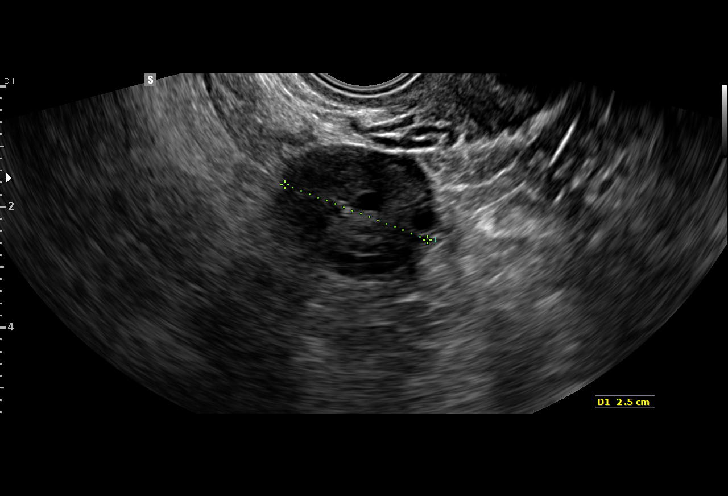
[im 41/49]
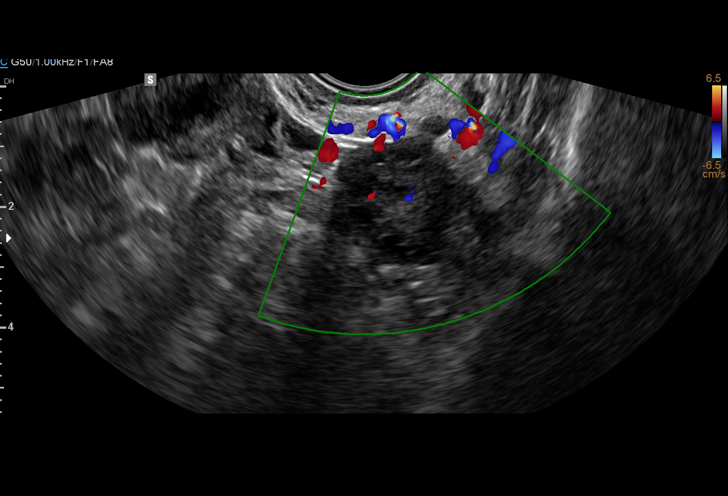
[im 45/49]
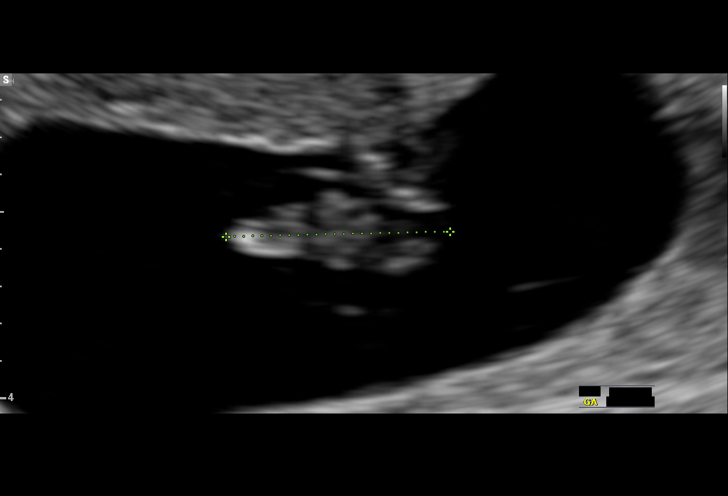
[im 49/49]
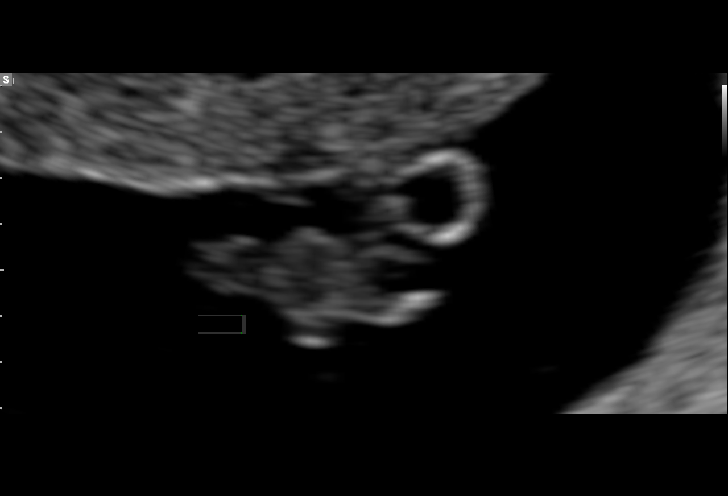

[15 of 28 positions shown; findings below may reference images not displayed]

FINDINGS: Intrauterine gestational sac: Single

Yolk sac:  Present

Embryo:  Present

Cardiac Activity: Present

Heart Rate: 147  bpm

CRL:  11.8  mm   7 w   3 d                  US EDC: 12/21/2016

Subchorionic hemorrhage:  None visualized.

Maternal uterus/adnexae: Tiny complex cyst right ovary, most likely
tiny corpus luteal cyst.
IMPRESSION: Single viable intrauterine pregnancy at 7 weeks 3 days.

## 2018-04-14 ENCOUNTER — Telehealth: Payer: Self-pay | Admitting: Endocrinology

## 2018-04-14 NOTE — Telephone Encounter (Signed)
error 

## 2018-04-14 NOTE — Telephone Encounter (Signed)
Need refill  methimazole (TAPAZOLE) 10 MG tablet [[id number]]  Send to  [id number] [place], [place] - [address] DEA #:  [id number]

## 2018-04-15 ENCOUNTER — Other Ambulatory Visit: Payer: Self-pay

## 2018-04-15 MED ORDER — METHIMAZOLE 10 MG PO TABS: 40.0000 mg | ORAL_TABLET | Freq: Two times a day (BID) | ORAL | 2 refills | Status: DC

## 2018-04-15 NOTE — Telephone Encounter (Signed)
This has been done.

## 2018-07-14 ENCOUNTER — Ambulatory Visit (INDEPENDENT_AMBULATORY_CARE_PROVIDER_SITE_OTHER): Payer: Self-pay | Admitting: Endocrinology

## 2018-07-14 ENCOUNTER — Encounter: Payer: Self-pay | Admitting: Endocrinology

## 2018-07-14 VITALS — BP 118/80 | HR 76 | Ht 62.0 in | Wt 161.0 lb

## 2018-07-14 DIAGNOSIS — E0590 Thyrotoxicosis, unspecified without thyrotoxic crisis or storm: Secondary | ICD-10-CM

## 2018-07-14 LAB — T4, FREE: Free T4: 0 ng/dL — ABNORMAL LOW (ref 0.60–1.60)

## 2018-07-14 NOTE — Progress Notes (Signed)
Subjective:    Patient ID: Penny Duffy, female    DOB: [birth year], 37 y.o.   MRN: [id number]  HPI Pt returns for f/u of hyperthyroidism(dx'ed 2018 (TSH was normal in 2017); she has never had thyroid imaging, but PE and severity suggest Grave's Dz;she chose tapazole rx).  She takes tapazole, and she seldom misses it.  pt states she feels well in general, except for difficulty losing weight.  She declines the RAI, at least for now.   Past Medical History:  Diagnosis Date  . Medical history non-contributory   . Pregnancy induced hypertension     Past Surgical History:  Procedure Laterality Date  . NO PAST SURGERIES      Social History   Socioeconomic History  . Marital status: Single    Spouse name: Not on file  . Number of children: Not on file  . Years of education: Not on file  . Highest education level: Not on file  Occupational History  . Not on file  Social Needs  . Financial resource strain: Not on file  . Food insecurity:    Worry: Not on file    Inability: Not on file  . Transportation needs:    Medical: Not on file    Non-medical: Not on file  Tobacco Use  . Smoking status: Former Smoker    Types: Cigarettes  . Smokeless tobacco: Never Used  Substance and Sexual Activity  . Alcohol use: No  . Drug use: No  . Sexual activity: Yes    Birth control/protection: None  Lifestyle  . Physical activity:    Days per week: Not on file    Minutes per session: Not on file  . Stress: Not on file  Relationships  . Social connections:    Talks on phone: Not on file    Gets together: Not on file    Attends religious service: Not on file    Active member of club or organization: Not on file    Attends meetings of clubs or organizations: Not on file    Relationship status: Not on file  . Intimate partner violence:    Fear of current or ex partner: Not on file    Emotionally abused: Not on file    Physically abused: Not on file    Forced sexual activity: Not on  file  Other Topics Concern  . Not on file  Social History Narrative  . Not on file    Current Outpatient Medications on File Prior to Visit  Medication Sig Dispense Refill  . acetaminophen (TYLENOL) 325 MG tablet Take 2 tablets (650 mg total) by mouth every 4 (four) hours as needed (for pain scale < 4). 30 tablet 0  . amphetamine-dextroamphetamine (ADDERALL) 30 MG tablet TK 1 T PO QAM AND 1 T Q NOON  0  . diazepam (VALIUM) 5 MG tablet TAKE 1 TABLET BY MOUTH 4 TIMES DAILY AS NEEDED  4  . furosemide (LASIX) 20 MG tablet Take 1 tablet (20 mg total) by mouth daily as needed for edema. 3 tablet 0  . ibuprofen (ADVIL,MOTRIN) 600 MG tablet Take 1 tablet (600 mg total) by mouth every 6 (six) hours. 30 tablet 0  . triamterene-hydrochlorothiazide (MAXZIDE) 75-50 MG tablet Take 1 tablet by mouth daily. 30 tablet 1   No current facility-administered medications on file prior to visit.     No Known Allergies  Family History  Problem Relation Age of Onset  . Thyroid disease Mother  BP 118/80 (BP Location: Right Arm, Patient Position: Sitting, Cuff Size: Normal)   Pulse 76   Ht 5\' 2"  (1.575 m)   Wt 161 lb (73 kg)   LMP 06/07/2018   SpO2 98%   BMI 29.45 kg/m   Review of Systems Denies fever.      Objective:   Physical Exam VITAL SIGNS:  See vs page GENERAL: no distress Eyes: bilat proptosis is noted. NECK: thyroid is 5 times normal size, R>L, no palpable nodule.   Lab Results  Component Value Date   TSH 168.30 (H) 07/14/2018      Assessment & Plan:  Hyperthyroidism: overcontrolled.  D/c tapazole.  Recheck TFT soon.

## 2018-07-14 NOTE — Patient Instructions (Addendum)
blood tests are requested for you today.  We'll let you know about the results. Based on the results, we will likely need to reduce the medication If ever you have fever while taking methimazole, stop it and call us, even if the reason is obvious, because of the risk of a rare side-effect.  Please come back for a follow-up appointment in 3 months.

## 2018-07-15 ENCOUNTER — Telehealth: Payer: Self-pay | Admitting: Endocrinology

## 2018-07-15 LAB — TSH: TSH: 168.3 u[IU]/mL — AB (ref 0.35–4.50)

## 2018-07-15 NOTE — Telephone Encounter (Signed)
Called and spoke with patient informing her of results and recommendations. Understanding verbalized nothing further needed at this time.  

## 2018-07-15 NOTE — Telephone Encounter (Signed)
Please advise. I don't see where labs have been results. And is there any change to her medications?

## 2018-07-15 NOTE — Telephone Encounter (Signed)
Yes, please d/c for now, because thyroid is too low.  Then please do blood tests next week.  Based on those results, we will probably need to resume the medication at a lower dosage.

## 2018-07-15 NOTE — Telephone Encounter (Signed)
Patient stated she received my chart message for lab results and would like to verify if she should stop taking her medication  Please advise

## 2018-07-23 ENCOUNTER — Encounter: Payer: Self-pay | Admitting: Family Medicine

## 2018-07-23 ENCOUNTER — Ambulatory Visit (INDEPENDENT_AMBULATORY_CARE_PROVIDER_SITE_OTHER): Payer: Self-pay | Admitting: *Deleted

## 2018-07-23 DIAGNOSIS — Z3201 Encounter for pregnancy test, result positive: Secondary | ICD-10-CM

## 2018-07-23 DIAGNOSIS — Z3400 Encounter for supervision of normal first pregnancy, unspecified trimester: Secondary | ICD-10-CM

## 2018-07-23 DIAGNOSIS — Z3480 Encounter for supervision of other normal pregnancy, unspecified trimester: Secondary | ICD-10-CM

## 2018-07-23 LAB — POCT PREGNANCY, URINE: PREG TEST UR: POSITIVE — AB

## 2018-07-23 MED ORDER — PRENATAL 27-0.8 MG PO TABS: 1.0000 | ORAL_TABLET | Freq: Every day | ORAL | 8 refills | Status: DC

## 2018-07-23 NOTE — Progress Notes (Signed)
Here for pregnancy test which was positive.  Pt states she has regular periods and LMP was either July 15th to July 18th. She is 1662w4d and her EDD is 03/14/19.  Would like to come to this office for her care.  Previous pregnancy was induction for HTN.  Pt reports she was taking Methimazole that was prescribed by [facility] for hyperthryoidism.  She stopped this medication August 21st d/t results and is to go back by 9/5 for another blood level draw.

## 2018-07-23 NOTE — Progress Notes (Signed)
I agree with the Rn's note and plan of care.  Venia Carbonasch, Jennifer I, NP 07/23/2018 2:09 PM

## 2018-07-29 ENCOUNTER — Other Ambulatory Visit (INDEPENDENT_AMBULATORY_CARE_PROVIDER_SITE_OTHER): Payer: Self-pay

## 2018-07-29 DIAGNOSIS — E0590 Thyrotoxicosis, unspecified without thyrotoxic crisis or storm: Secondary | ICD-10-CM

## 2018-07-29 LAB — T4, FREE: Free T4: 0.86 ng/dL (ref 0.60–1.60)

## 2018-07-29 LAB — TSH: TSH: 1.8 u[IU]/mL (ref 0.35–4.50)

## 2018-08-17 ENCOUNTER — Encounter: Payer: Self-pay | Admitting: Endocrinology

## 2018-08-20 ENCOUNTER — Ambulatory Visit (INDEPENDENT_AMBULATORY_CARE_PROVIDER_SITE_OTHER): Payer: Self-pay

## 2018-08-20 VITALS — BP 120/81 | HR 78 | Wt 162.5 lb

## 2018-08-20 DIAGNOSIS — Z3480 Encounter for supervision of other normal pregnancy, unspecified trimester: Secondary | ICD-10-CM | POA: Insufficient documentation

## 2018-08-20 DIAGNOSIS — Z113 Encounter for screening for infections with a predominantly sexual mode of transmission: Secondary | ICD-10-CM

## 2018-08-20 DIAGNOSIS — Z3481 Encounter for supervision of other normal pregnancy, first trimester: Secondary | ICD-10-CM

## 2018-08-20 DIAGNOSIS — E0590 Thyrotoxicosis, unspecified without thyrotoxic crisis or storm: Secondary | ICD-10-CM

## 2018-08-20 LAB — POCT URINALYSIS DIP (DEVICE)
Bilirubin Urine: NEGATIVE
Glucose, UA: NEGATIVE mg/dL
Ketones, ur: NEGATIVE mg/dL
NITRITE: NEGATIVE
PH: 7 (ref 5.0–8.0)
Protein, ur: NEGATIVE mg/dL
SPECIFIC GRAVITY, URINE: 1.02 (ref 1.005–1.030)
UROBILINOGEN UA: 0.2 mg/dL (ref 0.0–1.0)

## 2018-08-20 NOTE — Progress Notes (Addendum)
Subjective:   Penny Duffy is a 37 y.o. G2P1001 at [redacted]w[redacted]d by LMP being seen today for her first obstetrical visit.  Her obstetrical history is significant for pregnancy induced hypertension and has History of gestational hypertension; Hyperthyroidism; and Supervision of other normal pregnancy, antepartum on their problem list.. Patient does intend to breast feed. Pregnancy history fully reviewed.  Patient reports no complaints.  HISTORY: OB History  Gravida Para Term Preterm AB Living  2 1 1  0 0 1  SAB TAB Ectopic Multiple Live Births  0 0 0 0 1    # Outcome Date GA Lbr Len/2nd Weight Sex Delivery Anes PTL Lv  2 Current           1 Term 12/02/16 [redacted]w[redacted]d / 03:21 7 lb 1.4 oz (3.215 kg) F Vag-Spont EPI  LIV     Name: LEELAH, HANNA     Apgar1: 9  Apgar5: 9   Past Medical History:  Diagnosis Date  . Hyperthyroidism    Grave's Disease  . Medical history non-contributory   . Pregnancy induced hypertension    Past Surgical History:  Procedure Laterality Date  . NO PAST SURGERIES    . Wisdom Tooth Removal     Family History  Problem Relation Age of Onset  . Thyroid disease Mother   . Diabetes Maternal Grandmother   . Diabetes Maternal Grandfather    Social History   Tobacco Use  . Smoking status: Former Smoker    Types: Cigarettes  . Smokeless tobacco: Never Used  Substance Use Topics  . Alcohol use: Not Currently  . Drug use: No   No Known Allergies Current Outpatient Medications on File Prior to Visit  Medication Sig Dispense Refill  . acetaminophen (TYLENOL) 325 MG tablet Take 2 tablets (650 mg total) by mouth every 4 (four) hours as needed (for pain scale < 4). 30 tablet 0  . Prenatal Vit-Fe Fumarate-FA (MULTIVITAMIN-PRENATAL) 27-0.8 MG TABS tablet Take 1 tablet by mouth daily at 12 noon. 30 each 8   No current facility-administered medications on file prior to visit.      Exam   Vitals:   08/20/18 1021  BP: 120/81  Pulse: 78  Weight: 162 lb 8  oz (73.7 kg)      Uterus:     Pelvic Exam: Perineum: no hemorrhoids, normal perineum   Vulva: normal external genitalia, no lesions   Vagina:  normal mucosa, normal discharge   Cervix: no lesions and normal, pap smear done.    Adnexa: normal adnexa and no mass, fullness, tenderness   Bony Pelvis: average  System: General: well-developed, well-nourished female in no acute distress   Breast:  normal appearance, no masses or tenderness   Skin: normal coloration and turgor, no rashes   Neurologic: oriented, normal, negative, normal mood   Extremities: normal strength, tone, and muscle mass, ROM of all joints is normal   HEENT PERRLA, extraocular movement intact and sclera clear, anicteric   Mouth/Teeth mucous membranes moist, pharynx normal without lesions and dental hygiene good   Neck supple and no masses   Cardiovascular: regular rate and rhythm   Respiratory:  no respiratory distress, normal breath sounds   Abdomen: soft, non-tender; bowel sounds normal; no masses,  no organomegaly     Assessment:   Pregnancy: G2P1001 Patient Active Problem List   Diagnosis Date Noted  . Supervision of other normal pregnancy, antepartum 08/20/2018  . Hyperthyroidism 09/28/2017  . History of gestational hypertension 08/17/2017  Plan:  1. Supervision of other normal pregnancy, antepartum - No complaints. Routine care - Limited bedside u/s confirmed FHR present -Last pap 2018 and normal  2. Hyperthyroidism - Managed at [place] but due for blood work next week, will get labs today. Thyroid Panel With TSH  Initial labs drawn. Continue prenatal vitamins. Genetic Screening discussed, First trimester screen: ordered. Ultrasound discussed; fetal anatomic survey: ordered. Problem list reviewed and updated. The nature of [facility] with multiple MDs and other Advanced Practice Providers was explained to patient; also emphasized that residents, students are  part of our team. Routine obstetric precautions reviewed. Return in about 4 weeks (around 09/17/2018) for Return OB visit.   Rolm Bookbinder, CNM 08/20/18 10:27 AM

## 2018-08-20 NOTE — Patient Instructions (Addendum)
Safe Medications in Pregnancy   Acne: Benzoyl Peroxide Salicylic Acid  Backache/Headache: Tylenol: 2 regular strength every 4 hours OR              2 Extra strength every 6 hours  Colds/Coughs/Allergies: Benadryl (alcohol free) 25 mg every 6 hours as needed Breath right strips Claritin Cepacol throat lozenges Chloraseptic throat spray Cold-Eeze- up to three times per day Cough drops, alcohol free Flonase (by prescription only) Guaifenesin Mucinex Robitussin DM (plain only, alcohol free) Saline nasal spray/drops Sudafed (pseudoephedrine) & Actifed ** use only after [redacted] weeks gestation and if you do not have high blood pressure Tylenol Vicks Vaporub Zinc lozenges Zyrtec   Constipation: Colace Ducolax suppositories Fleet enema Glycerin suppositories Metamucil Milk of magnesia Miralax Senokot Smooth move tea  Diarrhea: Kaopectate Imodium A-D  *NO pepto Bismol  Hemorrhoids: Anusol Anusol HC Preparation H Tucks  Indigestion: Tums Maalox Mylanta Zantac  Pepcid  Insomnia: Benadryl (alcohol free) 25mg every 6 hours as needed Tylenol PM Unisom, no Gelcaps  Leg Cramps: Tums MagGel  Nausea/Vomiting:  Bonine Dramamine Emetrol Ginger extract Sea bands Meclizine  Nausea medication to take during pregnancy:  Unisom (doxylamine succinate 25 mg tablets) Take one tablet daily at bedtime. If symptoms are not adequately controlled, the dose can be increased to a maximum recommended dose of two tablets daily (1/2 tablet in the morning, 1/2 tablet mid-afternoon and one at bedtime). Vitamin B6 100mg tablets. Take one tablet twice a day (up to 200 mg per day).  Skin Rashes: Aveeno products Benadryl cream or 25mg every 6 hours as needed Calamine Lotion 1% cortisone cream  Yeast infection: Gyne-lotrimin 7 Monistat 7   **If taking multiple medications, please check labels to avoid duplicating the same active ingredients **take medication as directed on  the label ** Do not exceed 4000 mg of tylenol in 24 hours **Do not take medications that contain aspirin or ibuprofen    First Trimester of Pregnancy The first trimester of pregnancy is from week 1 until the end of week 13 (months 1 through 3). A week after a sperm fertilizes an egg, the egg will implant on the wall of the uterus. This embryo will begin to develop into a baby. Genes from you and your partner will form the baby. The female genes will determine whether the baby will be a boy or a girl. At 6-8 weeks, the eyes and face will be formed, and the heartbeat can be seen on ultrasound. At the end of 12 weeks, all the baby's organs will be formed. Now that you are pregnant, you will want to do everything you can to have a healthy baby. Two of the most important things are to get good prenatal care and to follow your health care provider's instructions. Prenatal care is all the medical care you receive before the baby's birth. This care will help prevent, find, and treat any problems during the pregnancy and childbirth. Body changes during your first trimester Your body goes through many changes during pregnancy. The changes vary from woman to woman.  You may gain or lose a couple of pounds at first.  You may feel sick to your stomach (nauseous) and you may throw up (vomit). If the vomiting is uncontrollable, call your health care provider.  You may tire easily.  You may develop headaches that can be relieved by medicines. All medicines should be approved by your health care provider.  You may urinate more often. Painful urination may mean you have a   bladder infection.  You may develop heartburn as a result of your pregnancy.  You may develop constipation because certain hormones are causing the muscles that push stool through your intestines to slow down.  You may develop hemorrhoids or swollen veins (varicose veins).  Your breasts may begin to grow larger and become tender. Your  nipples may stick out more, and the tissue that surrounds them (areola) may become darker.  Your gums may bleed and may be sensitive to brushing and flossing.  Dark spots or blotches (chloasma, mask of pregnancy) may develop on your face. This will likely fade after the baby is born.  Your menstrual periods will stop.  You may have a loss of appetite.  You may develop cravings for certain kinds of food.  You may have changes in your emotions from day to day, such as being excited to be pregnant or being concerned that something may go wrong with the pregnancy and baby.  You may have more vivid and strange dreams.  You may have changes in your hair. These can include thickening of your hair, rapid growth, and changes in texture. Some women also have hair loss during or after pregnancy, or hair that feels dry or thin. Your hair will most likely return to normal after your baby is born.  What to expect at prenatal visits During a routine prenatal visit:  You will be weighed to make sure you and the baby are growing normally.  Your blood pressure will be taken.  Your abdomen will be measured to track your baby's growth.  The fetal heartbeat will be listened to between weeks 10 and 14 of your pregnancy.  Test results from any previous visits will be discussed.  Your health care provider may ask you:  How you are feeling.  If you are feeling the baby move.  If you have had any abnormal symptoms, such as leaking fluid, bleeding, severe headaches, or abdominal cramping.  If you are using any tobacco products, including cigarettes, chewing tobacco, and electronic cigarettes.  If you have any questions.  Other tests that may be performed during your first trimester include:  Blood tests to find your blood type and to check for the presence of any previous infections. The tests will also be used to check for low iron levels (anemia) and protein on red blood cells (Rh antibodies).  Depending on your risk factors, or if you previously had diabetes during pregnancy, you may have tests to check for high blood sugar that affects pregnant women (gestational diabetes).  Urine tests to check for infections, diabetes, or protein in the urine.  An ultrasound to confirm the proper growth and development of the baby.  Fetal screens for spinal cord problems (spina bifida) and Down syndrome.  HIV (human immunodeficiency virus) testing. Routine prenatal testing includes screening for HIV, unless you choose not to have this test.  You may need other tests to make sure you and the baby are doing well.  Follow these instructions at home: Medicines  Follow your health care provider's instructions regarding medicine use. Specific medicines may be either safe or unsafe to take during pregnancy.  Take a prenatal vitamin that contains at least 600 micrograms (mcg) of folic acid.  If you develop constipation, try taking a stool softener if your health care provider approves. Eating and drinking  Eat a balanced diet that includes fresh fruits and vegetables, whole grains, good sources of protein such as meat, eggs, or tofu, and low-fat dairy.   Your health care provider will help you determine the amount of weight gain that is right for you.  Avoid raw meat and uncooked cheese. These carry germs that can cause birth defects in the baby.  Eating four or five small meals rather than three large meals a day may help relieve nausea and vomiting. If you start to feel nauseous, eating a few soda crackers can be helpful. Drinking liquids between meals, instead of during meals, also seems to help ease nausea and vomiting.  Limit foods that are high in fat and processed sugars, such as fried and sweet foods.  To prevent constipation: ? Eat foods that are high in fiber, such as fresh fruits and vegetables, whole grains, and beans. ? Drink enough fluid to keep your urine clear or pale  yellow. Activity  Exercise only as directed by your health care provider. Most women can continue their usual exercise routine during pregnancy. Try to exercise for 30 minutes at least 5 days a week. Exercising will help you: ? Control your weight. ? Stay in shape. ? Be prepared for labor and delivery.  Experiencing pain or cramping in the lower abdomen or lower back is a good sign that you should stop exercising. Check with your health care provider before continuing with normal exercises.  Try to avoid standing for long periods of time. Move your legs often if you must stand in one place for a long time.  Avoid heavy lifting.  Wear low-heeled shoes and practice good posture.  You may continue to have sex unless your health care provider tells you not to. Relieving pain and discomfort  Wear a good support bra to relieve breast tenderness.  Take warm sitz baths to soothe any pain or discomfort caused by hemorrhoids. Use hemorrhoid cream if your health care provider approves.  Rest with your legs elevated if you have leg cramps or low back pain.  If you develop varicose veins in your legs, wear support hose. Elevate your feet for 15 minutes, 3-4 times a day. Limit salt in your diet. Prenatal care  Schedule your prenatal visits by the twelfth week of pregnancy. They are usually scheduled monthly at first, then more often in the last 2 months before delivery.  Write down your questions. Take them to your prenatal visits.  Keep all your prenatal visits as told by your health care provider. This is important. Safety  Wear your seat belt at all times when driving.  Make a list of emergency phone numbers, including numbers for family, friends, the hospital, and police and fire departments. General instructions  Ask your health care provider for a referral to a local prenatal education class. Begin classes no later than the beginning of month 6 of your pregnancy.  Ask for help if  you have counseling or nutritional needs during pregnancy. Your health care provider can offer advice or refer you to specialists for help with various needs.  Do not use hot tubs, steam rooms, or saunas.  Do not douche or use tampons or scented sanitary pads.  Do not cross your legs for long periods of time.  Avoid cat litter boxes and soil used by cats. These carry germs that can cause birth defects in the baby and possibly loss of the fetus by miscarriage or stillbirth.  Avoid all smoking, herbs, alcohol, and medicines not prescribed by your health care provider. Chemicals in these products affect the formation and growth of the baby.  Do not use any products that   contain nicotine or tobacco, such as cigarettes and e-cigarettes. If you need help quitting, ask your health care provider. You may receive counseling support and other resources to help you quit.  Schedule a dentist appointment. At home, brush your teeth with a soft toothbrush and be gentle when you floss. Contact a health care provider if:  You have dizziness.  You have mild pelvic cramps, pelvic pressure, or nagging pain in the abdominal area.  You have persistent nausea, vomiting, or diarrhea.  You have a bad smelling vaginal discharge.  You have pain when you urinate.  You notice increased swelling in your face, hands, legs, or ankles.  You are exposed to fifth disease or chickenpox.  You are exposed to German measles (rubella) and have never had it. Get help right away if:  You have a fever.  You are leaking fluid from your vagina.  You have spotting or bleeding from your vagina.  You have severe abdominal cramping or pain.  You have rapid weight gain or loss.  You vomit blood or material that looks like coffee grounds.  You develop a severe headache.  You have shortness of breath.  You have any kind of trauma, such as from a fall or a car accident. Summary  The first trimester of pregnancy is  from week 1 until the end of week 13 (months 1 through 3).  Your body goes through many changes during pregnancy. The changes vary from woman to woman.  You will have routine prenatal visits. During those visits, your health care provider will examine you, discuss any test results you may have, and talk with you about how you are feeling. This information is not intended to replace advice given to you by your health care provider. Make sure you discuss any questions you have with your health care provider. Document Released: 11/04/2001 Document Revised: 10/22/2016 Document Reviewed: 10/22/2016 Elsevier Interactive Patient Education  2018 [employer]  

## 2018-08-21 LAB — OBSTETRIC PANEL, INCLUDING HIV
Antibody Screen: NEGATIVE
Basophils Absolute: 0 x10E3/uL (ref 0.0–0.2)
Basos: 0 %
EOS (ABSOLUTE): 0.1 x10E3/uL (ref 0.0–0.4)
Eos: 1 %
HIV Screen 4th Generation wRfx: NONREACTIVE
Hematocrit: 39.1 % (ref 34.0–46.6)
Hemoglobin: 13.1 g/dL (ref 11.1–15.9)
Hepatitis B Surface Ag: NEGATIVE
Immature Grans (Abs): 0 x10E3/uL (ref 0.0–0.1)
Immature Granulocytes: 0 %
Lymphocytes Absolute: 1.9 x10E3/uL (ref 0.7–3.1)
Lymphs: 16 %
MCH: 29 pg (ref 26.6–33.0)
MCHC: 33.5 g/dL (ref 31.5–35.7)
MCV: 87 fL (ref 79–97)
Monocytes Absolute: 0.7 x10E3/uL (ref 0.1–0.9)
Monocytes: 6 %
Neutrophils Absolute: 8.9 x10E3/uL — ABNORMAL HIGH (ref 1.4–7.0)
Neutrophils: 77 %
Platelets: 235 x10E3/uL (ref 150–450)
RBC: 4.51 x10E6/uL (ref 3.77–5.28)
RDW: 12.5 % (ref 12.3–15.4)
RPR Ser Ql: NONREACTIVE
Rh Factor: POSITIVE
Rubella Antibodies, IGG: 2.13 {index}
WBC: 11.7 x10E3/uL — ABNORMAL HIGH (ref 3.4–10.8)

## 2018-08-21 LAB — URINE CULTURE, OB REFLEX

## 2018-08-21 LAB — THYROID PANEL WITH TSH
Free Thyroxine Index: 2.7 (ref 1.2–4.9)
T3 Uptake Ratio: 23 % — ABNORMAL LOW (ref 24–39)
T4, Total: 11.7 ug/dL (ref 4.5–12.0)
TSH: 0.367 u[IU]/mL — AB (ref 0.450–4.500)

## 2018-08-21 LAB — CULTURE, OB URINE

## 2018-08-23 LAB — GC/CHLAMYDIA PROBE AMP (~~LOC~~) NOT AT ARMC
CHLAMYDIA, DNA PROBE: NEGATIVE
NEISSERIA GONORRHEA: NEGATIVE

## 2018-08-24 ENCOUNTER — Encounter: Payer: Self-pay | Admitting: *Deleted

## 2018-08-27 ENCOUNTER — Telehealth: Payer: Self-pay | Admitting: General Practice

## 2018-08-27 NOTE — Telephone Encounter (Signed)
Patient called & left message on nurse voicemail line stating she had a few questions for [place] regarding genetics and also wanted to see if her results were back yet. Called patient, no answer- left message stating we are trying to reach you to return your phone call, please call us back if you still have questions. Your results will show up in mychart when they are available and you may also send [place] a message through there as well if you'd like.

## 2018-08-30 ENCOUNTER — Telehealth: Payer: Self-pay | Admitting: *Deleted

## 2018-08-30 NOTE — Telephone Encounter (Signed)
Pt left message stating that she is calling with questions about test results.

## 2018-09-02 ENCOUNTER — Encounter: Payer: Self-pay | Admitting: *Deleted

## 2018-09-02 NOTE — Telephone Encounter (Signed)
Called patient, no answer- left message stating we are trying to reach you to return your phone call, please call us back if you still need assistance. You may also let us know if we can leave detailed information on your voicemail or send [place] a mychart message.

## 2018-09-16 ENCOUNTER — Ambulatory Visit (INDEPENDENT_AMBULATORY_CARE_PROVIDER_SITE_OTHER): Payer: Self-pay | Admitting: Obstetrics and Gynecology

## 2018-09-16 VITALS — BP 127/89 | HR 99 | Wt 171.4 lb

## 2018-09-16 DIAGNOSIS — Z3480 Encounter for supervision of other normal pregnancy, unspecified trimester: Secondary | ICD-10-CM

## 2018-09-16 DIAGNOSIS — O09522 Supervision of elderly multigravida, second trimester: Secondary | ICD-10-CM

## 2018-09-16 DIAGNOSIS — E0590 Thyrotoxicosis, unspecified without thyrotoxic crisis or storm: Secondary | ICD-10-CM

## 2018-09-16 DIAGNOSIS — Z8759 Personal history of other complications of pregnancy, childbirth and the puerperium: Secondary | ICD-10-CM

## 2018-09-16 MED ORDER — ASPIRIN EC 81 MG PO TBEC: 81.0000 mg | DELAYED_RELEASE_TABLET | Freq: Every day | ORAL | 2 refills | Status: DC

## 2018-09-16 NOTE — Progress Notes (Signed)
   PRENATAL VISIT NOTE  Subjective:  Penny Duffy is a 36 y.o. G2P1001 at [redacted]w[redacted]d being seen today for ongoing prenatal care.  She is currently monitored for the following issues for this low-risk pregnancy and has History of gestational hypertension; Hyperthyroidism; Supervision of other normal pregnancy, antepartum; and Advanced maternal age in multigravida, second trimester on their problem list.  Patient reports no complaints.  Contractions: Not present. Vag. Bleeding: None.   . Denies leaking of fluid.   The following portions of the patient's history were reviewed and updated as appropriate: allergies, current medications, past family history, past medical history, past social history, past surgical history and problem list. Problem list updated.  Objective:   Vitals:   09/16/18 1045  BP: 127/89  Pulse: 99  Weight: 171 lb 6.4 oz (77.7 kg)    Fetal Status: Fetal Heart Rate (bpm): 164         General:  Alert, oriented and cooperative. Patient is in no acute distress.  Skin: Skin is warm and dry. No rash noted.   Cardiovascular: Normal heart rate noted  Respiratory: Normal respiratory effort, no problems with respiration noted  Abdomen: Soft, gravid, appropriate for gestational age.  Pain/Pressure: Absent     Pelvic: Cervical exam deferred        Extremities: Normal range of motion.  Edema: None  Mental Status: Normal mood and affect. Normal behavior. Normal judgment and thought content.   Pt informed that the ultrasound is considered a limited OB ultrasound and is not intended to be a complete ultrasound exam.  Patient also informed that the ultrasound is not being completed with the intent of assessing for fetal or placental anomalies or any pelvic abnormalities.  Explained that the purpose of today's ultrasound is to assess for  viability.  Patient acknowledges the purpose of the exam and the limitations of the study.    Assessment and Plan:  Pregnancy: G2P1001 at [redacted]w[redacted]d  1.  History of gestational hypertension - Comp Met (CMET) - CBC - Protein / creatinine ratio, urine - HgB A1c - Start ASA at 16 weeks and continue until 6 weeks postpartum   2. Hyperthyroidism  History of with previous pregnancy, will plan to check thyroid panel Q trimester.   3. Supervision of other normal pregnancy, antepartum - HgB A1c  4. Advanced maternal age in multigravida, second trimester - AMB MFM GENETICS REFERRAL  Other orders - aspirin EC 81 MG tablet; Take 1 tablet (81 mg total) by mouth daily.   There are no diagnoses linked to this encounter. Preterm labor symptoms and general obstetric precautions including but not limited to vaginal bleeding, contractions, leaking of fluid and fetal movement were reviewed in detail with the patient. Please refer to After Visit Summary for other counseling recommendations.  Return in about 4 weeks (around 10/14/2018).  Future Appointments  Date Time Provider Department Center  09/22/2018 10:00 AM [facility] GENETIC COUNSELING RM [facility]  10/12/2018 10:55 AM Kooistra, Kathryn Lorraine, CNM [facility]  10/14/2018  3:00 PM Ellison, Sean, MD [facility] None  10/19/2018  1:15 PM [facility] US 4 [facility]    Jennifer Rasch, NP 

## 2018-09-16 NOTE — Patient Instructions (Signed)
Preeclampsia and Eclampsia °Preeclampsia is a serious condition that develops only during pregnancy. It is also called toxemia of pregnancy. This condition causes high blood pressure along with other symptoms, such as swelling and headaches. These symptoms may develop as the condition gets worse. Preeclampsia may occur at 20 weeks of pregnancy or later. °Diagnosing and treating preeclampsia early is very important. If not treated early, it can cause serious problems for you and your baby. One problem it can lead to is eclampsia, which is a condition that causes muscle jerking or shaking (convulsions or seizures) in the mother. Delivering your baby is the best treatment for preeclampsia or eclampsia. Preeclampsia and eclampsia symptoms usually go away after your baby is born. °What are the causes? °The cause of preeclampsia is not known. °What increases the risk? °The following risk factors make you more likely to develop preeclampsia: °· Being pregnant for the first time. °· Having had preeclampsia during a past pregnancy. °· Having a family history of preeclampsia. °· Having high blood pressure. °· Being pregnant with twins or triplets. °· Being 35 or older. °· Being African-American. °· Having kidney disease or diabetes. °· Having medical conditions such as lupus or blood diseases. °· Being very overweight (obese). ° °What are the signs or symptoms? °The earliest signs of preeclampsia are: °· High blood pressure. °· Increased protein in your urine. Your health care provider will check for this at every visit before you give birth (prenatal visit). ° °Other symptoms that may develop as the condition gets worse include: °· Severe headaches. °· Sudden weight gain. °· Swelling of the hands, face, legs, and feet. °· Nausea and vomiting. °· Vision problems, such as blurred or double vision. °· Numbness in the face, arms, legs, and feet. °· Urinating less than usual. °· Dizziness. °· Slurred speech. °· Abdominal pain,  especially upper abdominal pain. °· Convulsions or seizures. ° °Symptoms generally go away after giving birth. °How is this diagnosed? °There are no screening tests for preeclampsia. Your health care provider will ask you about symptoms and check for signs of preeclampsia during your prenatal visits. You may also have tests that include: °· Urine tests. °· Blood tests. °· Checking your blood pressure. °· Monitoring your baby’s heart rate. °· Ultrasound. ° °How is this treated? °You and your health care provider will determine the treatment approach that is best for you. Treatment may include: °· Having more frequent prenatal exams to check for signs of preeclampsia, if you have an increased risk for preeclampsia. °· Bed rest. °· Reducing how much salt (sodium) you eat. °· Medicine to lower your blood pressure. °· Staying in the hospital, if your condition is severe. There, treatment will focus on controlling your blood pressure and the amount of fluids in your body (fluid retention). °· You may need to take medicine (magnesium sulfate) to prevent seizures. This medicine may be given as an injection or through an IV tube. °· Delivering your baby early, if your condition gets worse. You may have your labor started with medicine (induced), or you may have a cesarean delivery. ° °Follow these instructions at home: °Eating and drinking ° °· Drink enough fluid to keep your urine clear or pale yellow. °· Eat a healthy diet that is low in sodium. Do not add salt to your food. Check nutrition labels to see how much sodium a food or beverage contains. °· Avoid caffeine. °Lifestyle °· Do not use any products that contain nicotine or tobacco, such as cigarettes   and e-cigarettes. If you need help quitting, ask your health care provider. °· Do not use alcohol or drugs. °· Avoid stress as much as possible. Rest and get plenty of sleep. °General instructions °· Take over-the-counter and prescription medicines only as told by your  health care provider. °· When lying down, lie on your side. This keeps pressure off of your baby. °· When sitting or lying down, raise (elevate) your feet. Try putting some pillows underneath your lower legs. °· Exercise regularly. Ask your health care provider what kinds of exercise are best for you. °· Keep all follow-up and prenatal visits as told by your health care provider. This is important. °How is this prevented? °To prevent preeclampsia or eclampsia from developing during another pregnancy: °· Get proper medical care during pregnancy. Your health care provider may be able to prevent preeclampsia or diagnose and treat it early. °· Your health care provider may have you take a low-dose aspirin or a calcium supplement during your next pregnancy. °· You may have tests of your blood pressure and kidney function after giving birth. °· Maintain a healthy weight. Ask your health care provider for help managing weight gain during pregnancy. °· Work with your health care provider to manage any long-term (chronic) health conditions you have, such as diabetes or kidney problems. ° °Contact a health care provider if: °· You gain more weight than expected. °· You have headaches. °· You have nausea or vomiting. °· You have abdominal pain. °· You feel dizzy or light-headed. °Get help right away if: °· You develop sudden or severe swelling anywhere in your body. This usually happens in the legs. °· You gain 5 lbs (2.3 kg) or more during one week. °· You have severe: °? Abdominal pain. °? Headaches. °? Dizziness. °? Vision problems. °? Confusion. °? Nausea or vomiting. °· You have a seizure. °· You have trouble moving any part of your body. °· You develop numbness in any part of your body. °· You have trouble speaking. °· You have any abnormal bleeding. °· You pass out. °This information is not intended to replace advice given to you by your health care provider. Make sure you discuss any questions you have with your health  care provider. °Document Released: 11/07/2000 Document Revised: 07/08/2016 Document Reviewed: 06/16/2016 °Elsevier Interactive Patient Education © 2018 [employer] ° °

## 2018-09-17 ENCOUNTER — Encounter: Payer: Self-pay | Admitting: Obstetrics & Gynecology

## 2018-09-17 LAB — COMPREHENSIVE METABOLIC PANEL
ALT: 7 IU/L (ref 0–32)
AST: 10 IU/L (ref 0–40)
Albumin/Globulin Ratio: 1.7 (ref 1.2–2.2)
Albumin: 4.2 g/dL (ref 3.5–5.5)
Alkaline Phosphatase: 74 IU/L (ref 39–117)
BUN/Creatinine Ratio: 16 (ref 9–23)
BUN: 8 mg/dL (ref 6–20)
CO2: 22 mmol/L (ref 20–29)
Calcium: 9.3 mg/dL (ref 8.7–10.2)
Chloride: 102 mmol/L (ref 96–106)
Creatinine, Ser: 0.51 mg/dL — ABNORMAL LOW (ref 0.57–1.00)
GFR calc non Af Amer: 123 mL/min/{1.73_m2} (ref >59)
GFR, EST AFRICAN AMERICAN: 142 mL/min/{1.73_m2} (ref >59)
GLUCOSE: 75 mg/dL (ref 65–99)
Globulin, Total: 2.5 g/dL (ref 1.5–4.5)
POTASSIUM: 4 mmol/L (ref 3.5–5.2)
SODIUM: 139 mmol/L (ref 134–144)
TOTAL PROTEIN: 6.7 g/dL (ref 6.0–8.5)

## 2018-09-17 LAB — PROTEIN / CREATININE RATIO, URINE
Creatinine, Urine: 82.2 mg/dL
Protein, Ur: 7.6 mg/dL
Protein/Creat Ratio: 92 mg/g creat (ref 0–200)

## 2018-09-17 LAB — HEMOGLOBIN A1C
ESTIMATED AVERAGE GLUCOSE: 97 mg/dL
HEMOGLOBIN A1C: 5 % (ref 4.8–5.6)

## 2018-09-17 LAB — CBC
Hematocrit: 36.1 % (ref 34.0–46.6)
Hemoglobin: 12.5 g/dL (ref 11.1–15.9)
MCH: 29.5 pg (ref 26.6–33.0)
MCHC: 34.6 g/dL (ref 31.5–35.7)
MCV: 85 fL (ref 79–97)
PLATELETS: 229 10*3/uL (ref 150–450)
RBC: 4.24 x10E6/uL (ref 3.77–5.28)
RDW: 12.3 % (ref 12.3–15.4)
WBC: 12.9 10*3/uL — ABNORMAL HIGH (ref 3.4–10.8)

## 2018-09-22 ENCOUNTER — Inpatient Hospital Stay (HOSPITAL_COMMUNITY): Admission: RE | Admit: 2018-09-22 | Payer: Self-pay | Source: Ambulatory Visit

## 2018-10-11 ENCOUNTER — Encounter (HOSPITAL_COMMUNITY): Payer: Self-pay

## 2018-10-12 ENCOUNTER — Ambulatory Visit (INDEPENDENT_AMBULATORY_CARE_PROVIDER_SITE_OTHER): Payer: Self-pay | Admitting: Student

## 2018-10-12 ENCOUNTER — Other Ambulatory Visit: Payer: Self-pay

## 2018-10-12 VITALS — BP 128/67 | HR 80 | Wt 172.6 lb

## 2018-10-12 DIAGNOSIS — Z3480 Encounter for supervision of other normal pregnancy, unspecified trimester: Secondary | ICD-10-CM

## 2018-10-12 DIAGNOSIS — Z8759 Personal history of other complications of pregnancy, childbirth and the puerperium: Secondary | ICD-10-CM

## 2018-10-12 DIAGNOSIS — Z3482 Encounter for supervision of other normal pregnancy, second trimester: Secondary | ICD-10-CM

## 2018-10-12 DIAGNOSIS — E0590 Thyrotoxicosis, unspecified without thyrotoxic crisis or storm: Secondary | ICD-10-CM

## 2018-10-12 DIAGNOSIS — Z3A18 18 weeks gestation of pregnancy: Secondary | ICD-10-CM

## 2018-10-12 NOTE — Assessment & Plan Note (Deleted)
Patient had TSH of 1.8 in August, per MyChart message from Dr. Everardo AllEllison, patient should come back for follow up visit in 1 month but patient started prenatal care and did not have endocrinology appt bc we were doing labs. TSH in October is now 0.367. Patient has appt with [facility] on 11-21-; recommended that she keep that appointment to discuss results.  -Will also do referral to MFM for recommendations

## 2018-10-12 NOTE — Progress Notes (Addendum)
      PRENATAL VISIT NOTE  Subjective:  Penny Duffy is a 37 y.o. G2P1001 at 2882w1d being seen today for ongoing prenatal care.  She is currently monitored for the following issues for this high-risk pregnancy and has History of gestational hypertension; Hyperthyroidism; Supervision of other normal pregnancy, antepartum; and Advanced maternal age in multigravida, second trimester on their problem list.  Patient reports no complaints. In August her endocrinologist took her off her medications because her thyroid function was n Contractions: Not present. Vag. Bleeding: None.  Movement: Absent. Denies leaking of fluid.   The following portions of the patient's history were reviewed and updated as appropriate: allergies, current medications, past family history, past medical history, past social history, past surgical history and problem list. Problem list updated.  Objective:   Vitals:   10/12/18 1109 10/12/18 1218  BP: (!) 126/91 128/67  Pulse: 80   Weight: 172 lb 9.6 oz (78.3 kg)     Fetal Status: Fetal Heart Rate (bpm): 146   Movement: Absent     General:  Alert, oriented and cooperative. Patient is in no acute distress.  Skin: Skin is warm and dry. No rash noted.   Cardiovascular: Normal heart rate noted  Respiratory: Normal respiratory effort, no problems with respiration noted  Abdomen: Soft, gravid, appropriate for gestational age.  Pain/Pressure: Absent     Pelvic: Cervical exam deferred        Extremities: Normal range of motion.  Edema: None  Mental Status: Normal mood and affect. Normal behavior. Normal judgment and thought content.   Assessment and Plan:  Pregnancy: G2P1001 at 6782w1d  1. Supervision of other normal pregnancy, antepartum - SMN1 COPY NUMBER ANALYSIS (SMA Carrier Screen) - AFP, Serum, Open Spina Bifida  2. Hyperthyroidism -Keep appt with [facility] on Thursday to determine if medicine is appropriate or not; ob preference is for Endocrine to manage  patient's medication while pregnant  - AMB referral to maternal fetal medicine to determine antenatal testing.  -Thyroid on 9/27 was 0.367; slightly below normal. [facility] to make recommendation on medication management and management.    3. History of gestational hypertension One elevate BP today but on retake it was normal.  Still taking baby ASA .  Needs to see MD at next OB visit.     Preterm labor symptoms and general obstetric precautions including but not limited to vaginal bleeding, contractions, leaking of fluid and fetal movement were reviewed in detail with the patient. Please refer to After Visit Summary for other counseling recommendations.  Return in about 4 weeks (around 11/09/2018), or see MD for HROB.  Future Appointments  Date Time Provider Department Center  10/14/2018  3:00 PM Romero BellingEllison, Sean, MD [facility] None  10/19/2018  1:15 PM [facility] US 4 [facility] M[facility]-US  11/09/2018 10:00 AM [facility] MD RM [facility] MFC-US  11/09/2018 11:15 AM Constant, Gigi GinPeggy, [facility]    Charlesetta GaribaldiKathryn Lorraine McNaryKooistra, [place]

## 2018-10-12 NOTE — Patient Instructions (Addendum)
-Keep US appt on 26 -  Second Trimester of Pregnancy The second trimester is from week 13 through week 28, month 4 through 6. This is often the time in pregnancy that you feel your best. Often times, morning sickness has lessened or quit. You may have more energy, and you may get hungry more often. Your unborn baby (fetus) is growing rapidly. At the end of the sixth month, he or she is about 9 inches long and weighs about 1 pounds. You will likely feel the baby move (quickening) between 18 and 20 weeks of pregnancy. Follow these instructions at home:  Avoid all smoking, herbs, and alcohol. Avoid drugs not approved by your doctor.  Do not use any tobacco products, including cigarettes, chewing tobacco, and electronic cigarettes. If you need help quitting, ask your doctor. You may get counseling or other support to help you quit.  Only take medicine as told by your doctor. Some medicines are safe and some are not during pregnancy.  Exercise only as told by your doctor. Stop exercising if you start having cramps.  Eat regular, healthy meals.  Wear a good support bra if your breasts are tender.  Do not use hot tubs, steam rooms, or saunas.  Wear your seat belt when driving.  Avoid raw meat, uncooked cheese, and liter boxes and soil used by cats.  Take your prenatal vitamins.  Take 1500-2000 milligrams of calcium daily starting at the 20th week of pregnancy until you deliver your baby.  Try taking medicine that helps you poop (stool softener) as needed, and if your doctor approves. Eat more fiber by eating fresh fruit, vegetables, and whole grains. Drink enough fluids to keep your pee (urine) clear or pale yellow.  Take warm water baths (sitz baths) to soothe pain or discomfort caused by hemorrhoids. Use hemorrhoid cream if your doctor approves.  If you have puffy, bulging veins (varicose veins), wear support hose. Raise (elevate) your feet for 15 minutes, 3-4 times a day. Limit salt in  your diet.  Avoid heavy lifting, wear low heals, and sit up straight.  Rest with your legs raised if you have leg cramps or low back pain.  Visit your dentist if you have not gone during your pregnancy. Use a soft toothbrush to brush your teeth. Be gentle when you floss.  You can have sex (intercourse) unless your doctor tells you not to.  Go to your doctor visits. Get help if:  You feel dizzy.  You have mild cramps or pressure in your lower belly (abdomen).  You have a nagging pain in your belly area.  You continue to feel sick to your stomach (nauseous), throw up (vomit), or have watery poop (diarrhea).  You have bad smelling fluid coming from your vagina.  You have pain with peeing (urination). Get help right away if:  You have a fever.  You are leaking fluid from your vagina.  You have spotting or bleeding from your vagina.  You have severe belly cramping or pain.  You lose or gain weight rapidly.  You have trouble catching your breath and have chest pain.  You notice sudden or extreme puffiness (swelling) of your face, hands, ankles, feet, or legs.  You have not felt the baby move in over an hour.  You have severe headaches that do not go away with medicine.  You have vision changes. This information is not intended to replace advice given to you by your health care provider. Make sure you discuss any  questions you have with your health care provider. Document Released: 02/04/2010 Document Revised: 04/17/2016 Document Reviewed: 01/11/2013 Elsevier Interactive Patient Education  2017 [employer].

## 2018-10-14 ENCOUNTER — Encounter: Payer: Self-pay | Admitting: Endocrinology

## 2018-10-14 ENCOUNTER — Ambulatory Visit (INDEPENDENT_AMBULATORY_CARE_PROVIDER_SITE_OTHER): Payer: Self-pay | Admitting: Endocrinology

## 2018-10-14 VITALS — BP 116/68 | HR 85 | Ht 62.0 in | Wt 174.4 lb

## 2018-10-14 DIAGNOSIS — E0590 Thyrotoxicosis, unspecified without thyrotoxic crisis or storm: Secondary | ICD-10-CM

## 2018-10-14 NOTE — Progress Notes (Signed)
   Subjective:    Patient ID: Penny Duffy, female    DOB: 1981/08/05, 37 y.o.   MRN: [id number]  HPI    Review of Systems     Objective:   Physical Exam        Assessment & Plan:

## 2018-10-14 NOTE — Progress Notes (Signed)
Subjective:    Patient ID: Penny Duffy, female    DOB: 09/25/1981, 37 y.o.   MRN: [id number]  HPI Pt returns for f/u of hyperthyroidism (dx'ed 2018 (TSH was normal in 2017; she declines RAI); she has never had thyroid imaging, but PE and severity suggest Grave's Dz;she chose tapazole rx).  She stopped tapazole at time of dx of pregnancy.  She is now at 18 weeks.  pt states she feels well in general.  Specifically, she denies nausea.   Past Medical History:  Diagnosis Date  . Hyperthyroidism    Grave's Disease  . Medical history non-contributory   . Pregnancy induced hypertension     Past Surgical History:  Procedure Laterality Date  . NO PAST SURGERIES    . Wisdom Tooth Removal      Social History   Socioeconomic History  . Marital status: Single    Spouse name: Not on file  . Number of children: Not on file  . Years of education: Not on file  . Highest education level: Not on file  Occupational History  . Not on file  Social Needs  . Financial resource strain: Not on file  . Food insecurity:    Worry: Not on file    Inability: Not on file  . Transportation needs:    Medical: Not on file    Non-medical: Not on file  Tobacco Use  . Smoking status: Former Smoker    Types: Cigarettes  . Smokeless tobacco: Never Used  Substance and Sexual Activity  . Alcohol use: Not Currently  . Drug use: No  . Sexual activity: Yes    Birth control/protection: None  Lifestyle  . Physical activity:    Days per week: Not on file    Minutes per session: Not on file  . Stress: Not on file  Relationships  . Social connections:    Talks on phone: Not on file    Gets together: Not on file    Attends religious service: Not on file    Active member of club or organization: Not on file    Attends meetings of clubs or organizations: Not on file    Relationship status: Not on file  . Intimate partner violence:    Fear of current or ex partner: Not on file    Emotionally abused:  Not on file    Physically abused: Not on file    Forced sexual activity: Not on file  Other Topics Concern  . Not on file  Social History Narrative  . Not on file    Current Outpatient Medications on File Prior to Visit  Medication Sig Dispense Refill  . acetaminophen (TYLENOL) 325 MG tablet Take 2 tablets (650 mg total) by mouth every 4 (four) hours as needed (for pain scale < 4). 30 tablet 0  . aspirin EC 81 MG tablet Take 1 tablet (81 mg total) by mouth daily. 90 tablet 2  . Prenatal Vit-Fe Fumarate-FA (MULTIVITAMIN-PRENATAL) 27-0.8 MG TABS tablet Take 1 tablet by mouth daily at 12 noon. 30 each 8   No current facility-administered medications on file prior to visit.     No Known Allergies  Family History  Problem Relation Age of Onset  . Thyroid disease Mother   . Diabetes Maternal Grandmother   . Diabetes Maternal Grandfather     BP 116/68 (BP Location: Right Arm, Patient Position: Sitting, Cuff Size: Normal)   Pulse 85   Ht 5\' 2"  (1.575 m)   Wt  174 lb 6.4 oz (79.1 kg)   LMP 06/07/2018   SpO2 96%   BMI 31.90 kg/m    Review of Systems She has gained 12 lbs so far.      Objective:   Physical Exam VITAL SIGNS:  See vs page GENERAL: no distress Eyes: bilat proptosis is again noted. NECK: thyroid is 3 times normal size, diffuse. no palpable nodule.   Lab Results  Component Value Date   TSH 0.20 (L) 10/14/2018   T4TOTAL 11.7 08/20/2018      Assessment & Plan:  Hyperthyroidism: mild Pregnancy: we'll hold off on tapazole for now, at this TSH level  Patient Instructions  blood tests are requested for you today.  We'll let you know about the results.   Based on the results, we may need to resume the medication.   If ever you have fever while taking methimazole, stop it and call us, even if the reason is obvious, because of the risk of a rare side-effect.  Please come back for a follow-up appointment in 4 weeks.

## 2018-10-14 NOTE — Patient Instructions (Addendum)
blood tests are requested for you today.  We'll let you know about the results.   Based on the results, we may need to resume the medication.   If ever you have fever while taking methimazole, stop it and call us, even if the reason is obvious, because of the risk of a rare side-effect.  Please come back for a follow-up appointment in 4 weeks.

## 2018-10-15 LAB — T4, FREE: Free T4: 0.75 ng/dL (ref 0.60–1.60)

## 2018-10-15 LAB — TSH: TSH: 0.2 u[IU]/mL — AB (ref 0.35–4.50)

## 2018-10-18 LAB — AFP, SERUM, OPEN SPINA BIFIDA
AFP MoM: 0.94
AFP Value: 37.2 ng/mL
GEST. AGE ON COLLECTION DATE: 18 wk
MATERNAL AGE AT EDD: 38.4 a
OSBR Risk 1 IN: 10000
Test Results:: NEGATIVE
Weight: 172 [lb_av]

## 2018-10-18 LAB — SMN1 COPY NUMBER ANALYSIS (SMA CARRIER SCREENING)

## 2018-10-19 ENCOUNTER — Encounter (HOSPITAL_COMMUNITY): Payer: Self-pay

## 2018-10-19 ENCOUNTER — Ambulatory Visit (HOSPITAL_COMMUNITY)
Admission: RE | Admit: 2018-10-19 | Discharge: 2018-10-19 | Disposition: A | Discharge status: Home / Self Care | Payer: Managed Care, Other (non HMO) | Source: Ambulatory Visit

## 2018-10-19 DIAGNOSIS — Z3687 Encounter for antenatal screening for uncertain dates: Secondary | ICD-10-CM | POA: Diagnosis not present

## 2018-10-19 DIAGNOSIS — O99282 Endocrine, nutritional and metabolic diseases complicating pregnancy, second trimester: Secondary | ICD-10-CM | POA: Diagnosis not present

## 2018-10-19 DIAGNOSIS — Z3480 Encounter for supervision of other normal pregnancy, unspecified trimester: Secondary | ICD-10-CM

## 2018-10-19 DIAGNOSIS — O09522 Supervision of elderly multigravida, second trimester: Secondary | ICD-10-CM | POA: Diagnosis not present

## 2018-10-19 DIAGNOSIS — Z3A18 18 weeks gestation of pregnancy: Secondary | ICD-10-CM | POA: Insufficient documentation

## 2018-10-19 DIAGNOSIS — O09292 Supervision of pregnancy with other poor reproductive or obstetric history, second trimester: Secondary | ICD-10-CM | POA: Diagnosis not present

## 2018-10-19 DIAGNOSIS — E0590 Thyrotoxicosis, unspecified without thyrotoxic crisis or storm: Secondary | ICD-10-CM | POA: Insufficient documentation

## 2018-10-19 DIAGNOSIS — Z363 Encounter for antenatal screening for malformations: Secondary | ICD-10-CM | POA: Insufficient documentation

## 2018-10-19 DIAGNOSIS — Z8759 Personal history of other complications of pregnancy, childbirth and the puerperium: Secondary | ICD-10-CM

## 2018-10-20 ENCOUNTER — Other Ambulatory Visit (HOSPITAL_COMMUNITY): Payer: Self-pay | Admitting: *Deleted

## 2018-10-20 DIAGNOSIS — Z362 Encounter for other antenatal screening follow-up: Secondary | ICD-10-CM

## 2018-11-09 ENCOUNTER — Ambulatory Visit (HOSPITAL_COMMUNITY)
Admission: RE | Admit: 2018-11-09 | Discharge: 2018-11-09 | Disposition: A | Discharge status: Home / Self Care | Payer: Managed Care, Other (non HMO) | Source: Ambulatory Visit | Attending: Obstetrics and Gynecology | Admitting: Obstetrics and Gynecology

## 2018-11-09 ENCOUNTER — Ambulatory Visit (INDEPENDENT_AMBULATORY_CARE_PROVIDER_SITE_OTHER): Payer: Self-pay | Admitting: Obstetrics and Gynecology

## 2018-11-09 ENCOUNTER — Encounter: Payer: Self-pay | Admitting: Obstetrics and Gynecology

## 2018-11-09 ENCOUNTER — Ambulatory Visit: Payer: Self-pay | Admitting: Endocrinology

## 2018-11-09 ENCOUNTER — Encounter (HOSPITAL_COMMUNITY): Payer: Self-pay

## 2018-11-09 VITALS — BP 138/88 | HR 90 | Wt 176.1 lb

## 2018-11-09 DIAGNOSIS — Z8759 Personal history of other complications of pregnancy, childbirth and the puerperium: Secondary | ICD-10-CM

## 2018-11-09 DIAGNOSIS — E0590 Thyrotoxicosis, unspecified without thyrotoxic crisis or storm: Secondary | ICD-10-CM | POA: Diagnosis present

## 2018-11-09 DIAGNOSIS — Z3480 Encounter for supervision of other normal pregnancy, unspecified trimester: Secondary | ICD-10-CM

## 2018-11-09 DIAGNOSIS — Z3482 Encounter for supervision of other normal pregnancy, second trimester: Secondary | ICD-10-CM

## 2018-11-09 DIAGNOSIS — O09522 Supervision of elderly multigravida, second trimester: Secondary | ICD-10-CM

## 2018-11-09 DIAGNOSIS — O99282 Endocrine, nutritional and metabolic diseases complicating pregnancy, second trimester: Secondary | ICD-10-CM | POA: Diagnosis not present

## 2018-11-09 DIAGNOSIS — Z3A21 21 weeks gestation of pregnancy: Secondary | ICD-10-CM | POA: Insufficient documentation

## 2018-11-09 NOTE — Progress Notes (Signed)
   PRENATAL VISIT NOTE  Subjective:  Penny Duffy is a 37 y.o. G2P1001 at 6928w0d being seen today for ongoing prenatal care.  She is currently monitored for the following issues for this high-risk pregnancy and has History of gestational hypertension; Hyperthyroidism; Supervision of other normal pregnancy, antepartum; and Advanced maternal age in multigravida, second trimester on their problem list.  Patient reports no complaints.  Contractions: Not present. Vag. Bleeding: None.  Movement: Present. Denies leaking of fluid.   The following portions of the patient's history were reviewed and updated as appropriate: allergies, current medications, past family history, past medical history, past social history, past surgical history and problem list. Problem list updated.  Objective:   Vitals:   11/09/18 1119  BP: 138/88  Pulse: 90  Weight: 176 lb 1.6 oz (79.9 kg)    Fetal Status: Fetal Heart Rate (bpm): 141 Fundal Height: 21 cm Movement: Present     General:  Alert, oriented and cooperative. Patient is in no acute distress.  Skin: Skin is warm and dry. No rash noted.   Cardiovascular: Normal heart rate noted  Respiratory: Normal respiratory effort, no problems with respiration noted  Abdomen: Soft, gravid, appropriate for gestational age.  Pain/Pressure: Present     Pelvic: Cervical exam deferred        Extremities: Normal range of motion.  Edema: None  Mental Status: Normal mood and affect. Normal behavior. Normal judgment and thought content.   Assessment and Plan:  Pregnancy: G2P1001 at 6228w0d  1. Supervision of other normal pregnancy, antepartum Patient is doing well without complaints Follow up growth ultrasound  2. History of gestational hypertension Continue ASA  3. Advanced maternal age in multigravida, second trimester   4. Hyperthyroidism No meds Patient seen by endo and MFM. Plan for repeat TSH every 6-8 weeks  Preterm labor symptoms and general obstetric  precautions including but not limited to vaginal bleeding, contractions, leaking of fluid and fetal movement were reviewed in detail with the patient. Please refer to After Visit Summary for other counseling recommendations.  No follow-ups on file.  Future Appointments  Date Time Provider Department Center  11/16/2018 10:00 AM [facility] US 3 [facility]-US    Catalina AntiguaPeggy Ashland Osmer, MD

## 2018-11-09 NOTE — Consult Note (Addendum)
Consultation:   Penny Duffy is a 37 yo Caucasian female, G 2 P 1001 LMP June, 2019  [facility]  @ 21 2/7weeks seen in consultation as requested secondary to:  1) Hyperthyroidism - 2018 - Grave's Disease - Methimazole 80 mg QD until August 2019.  She is currently not on any medication.  10/14/18 - TSH - 0.20 uIU/ml; Free T4 - 0.75 ng/ml; Free Thyroxine index    PREVIOUS OBSTETRICAL HISTORY:  1) 2018 - NSVD @ 37 weeks, female, 7 lb 1 oz, complicated by pre-eclampsia     PREVIOUS GYN HISTORY:   Abnormal PAP - 2016 - neg F/U   GC - neg   Chlamydia - neg    Syphilis - neg   CAT - 15 x irreg x 7   Contraception - none    PREVIOUS MEDICAL HISTORY:  DM - neg   HTN - neg   Asthma - neg   Thyroid - as above   Rheumatic Fever - neg    Heart - neg   Lung - neg   Liver - neg   Kidney - neg   Epilepsy - neg    TB - neg   Herpes - neg   UTI - neg        PREVIOUS SURGICAL HISTORY:  None    MEDICATIONS:  Prenatal Vitamins, LDA 81 ng QD       ALLERGIES/REACTIONS:  None      HABITS:  Smoking - neg   Drinking - neg   Drugs - neg    PSYCHOSOCIAL:  Single; FOB is involved      PROFESSION:  Therapist, sportsegional Talent Manager    FAMILY HISTORY:  DM - neg   HTN - GM (d)   Twins - neg   Stillborns - neg    Birth Defects - neg   Mental Retardation - neg   Blood Dyscrasias - neg   Anesthesia Complications - neg    Genetic - neg          THYROID DYSFUNCTION:  General counseling was then performed regarding thyroid dysfunction in pregnancy.  Both Hyperthyroidism and Hypothyroidism are associated with increased fetal wastage and perinatal morbidity & mortality.  Hyperthyroidism may be associated with IUGR.  The role of serial U/S every 4 weeks for fetal growth with close A-P surveillance beginning in the third trimester was explained.  PTU and Methimazole should be kept at the lowest dose which will reduce the patient's thyroid functions to the upper limit of normal in order to  reduce the amount passing to the fetus.  TSH in trimester specific reference range: 1) First trimester - 0.1 - 2.5 mU/L, 2) Second trimester - 0.2 - 3 mU/L and 3) Third trimester - 0.3 - 3 mU/L.   IMPRESSIONS:  1) Hyperthyroidism "controlled" as far as pregnancy is concerned    RECOMMENDATIONS:  1) Check TSH/Free T-4 every 6 - 8 weeks 2) Serial U/S every 4 weeks for feta growth            40  minutes spent in face-to-face consultation with greater than 50% of the time spent in counseling.    Thank you for utilizing our ultrasound and consultative services.  If I may be of any further service, please do not hesitate to contact me.  Sincerely,   Patsi Searsobert L. Whitnie Deleon, MD Maternal-Fetal Medicine   Copy of report sent to practitioner/clinic.

## 2018-11-16 ENCOUNTER — Other Ambulatory Visit (HOSPITAL_COMMUNITY): Payer: Self-pay | Admitting: *Deleted

## 2018-11-16 ENCOUNTER — Encounter (HOSPITAL_COMMUNITY): Payer: Self-pay

## 2018-11-16 ENCOUNTER — Ambulatory Visit (HOSPITAL_COMMUNITY)
Admission: RE | Admit: 2018-11-16 | Discharge: 2018-11-16 | Disposition: A | Discharge status: Home / Self Care | Payer: Managed Care, Other (non HMO) | Source: Ambulatory Visit | Attending: Obstetrics and Gynecology | Admitting: Obstetrics and Gynecology

## 2018-11-16 DIAGNOSIS — O09522 Supervision of elderly multigravida, second trimester: Secondary | ICD-10-CM | POA: Diagnosis not present

## 2018-11-16 DIAGNOSIS — O99282 Endocrine, nutritional and metabolic diseases complicating pregnancy, second trimester: Secondary | ICD-10-CM | POA: Insufficient documentation

## 2018-11-16 DIAGNOSIS — Z3A22 22 weeks gestation of pregnancy: Secondary | ICD-10-CM | POA: Insufficient documentation

## 2018-11-16 DIAGNOSIS — O09292 Supervision of pregnancy with other poor reproductive or obstetric history, second trimester: Secondary | ICD-10-CM | POA: Insufficient documentation

## 2018-11-16 DIAGNOSIS — E0590 Thyrotoxicosis, unspecified without thyrotoxic crisis or storm: Secondary | ICD-10-CM

## 2018-11-16 DIAGNOSIS — Z362 Encounter for other antenatal screening follow-up: Secondary | ICD-10-CM | POA: Insufficient documentation

## 2018-11-16 DIAGNOSIS — O99280 Endocrine, nutritional and metabolic diseases complicating pregnancy, unspecified trimester: Principal | ICD-10-CM

## 2018-11-24 HISTORY — PX: EYE SURGERY: SHX253

## 2018-11-24 NOTE — L&D Delivery Note (Signed)
Delivery Note 38 y.o. G2P1001 at [redacted]w[redacted]d undergoing IOL for GHTN. Also history of hyperthyroidism on PTU.  Had prolonged second stage.  At 4:32 PM a viable female was delivered via Vaginal, Spontaneous (Presentation:vertex;LOA).  APGAR: 8, 9.   Placenta status: delivered intact, spontaneously  Cord: 3VC with the following complications: None  Anesthesia: Epidural and local  Episiotomy: None Lacerations: 2nd degree;Perineal Suture Repair: 3.0 vicryl rapide Est. Blood Loss (mL): 217  Mom to postpartum.  Baby to Couplet care / Skin to Skin.  Jaynie Collins, MD 03/10/2019, 5:21 PM

## 2018-12-08 ENCOUNTER — Encounter: Payer: Self-pay | Admitting: Obstetrics & Gynecology

## 2018-12-08 ENCOUNTER — Ambulatory Visit (INDEPENDENT_AMBULATORY_CARE_PROVIDER_SITE_OTHER): Payer: Self-pay | Admitting: Family Medicine

## 2018-12-08 VITALS — BP 126/80 | HR 118 | Wt 179.7 lb

## 2018-12-08 DIAGNOSIS — O99282 Endocrine, nutritional and metabolic diseases complicating pregnancy, second trimester: Secondary | ICD-10-CM

## 2018-12-08 DIAGNOSIS — E0590 Thyrotoxicosis, unspecified without thyrotoxic crisis or storm: Secondary | ICD-10-CM

## 2018-12-08 DIAGNOSIS — Z8759 Personal history of other complications of pregnancy, childbirth and the puerperium: Secondary | ICD-10-CM

## 2018-12-08 DIAGNOSIS — O99280 Endocrine, nutritional and metabolic diseases complicating pregnancy, unspecified trimester: Secondary | ICD-10-CM | POA: Insufficient documentation

## 2018-12-08 DIAGNOSIS — Z3480 Encounter for supervision of other normal pregnancy, unspecified trimester: Secondary | ICD-10-CM

## 2018-12-08 DIAGNOSIS — O09522 Supervision of elderly multigravida, second trimester: Secondary | ICD-10-CM

## 2018-12-08 NOTE — Patient Instructions (Signed)

## 2018-12-08 NOTE — Progress Notes (Signed)
   PRENATAL VISIT NOTE  Subjective:  Penny Duffy is a 38 y.o. G2P1001 at [redacted]w[redacted]d being seen today for ongoing prenatal care.  She is currently monitored for the following issues for this high-risk pregnancy and has History of gestational hypertension; Hyperthyroidism; Supervision of other normal pregnancy, antepartum; Advanced maternal age in multigravida, second trimester; and Hyperthyroidism in pregnancy, antepartum on their problem list.  Patient reports no complaints.  Contractions: Irritability. Vag. Bleeding: None.  Movement: Present. Denies leaking of fluid.   The following portions of the patient's history were reviewed and updated as appropriate: allergies, current medications, past family history, past medical history, past social history, past surgical history and problem list. Problem list updated.  Objective:   Vitals:   12/08/18 1025  BP: 126/80  Pulse: (!) 118  Weight: 179 lb 11.2 oz (81.5 kg)    Fetal Status: Fetal Heart Rate (bpm): 135   Movement: Present     General:  Alert, oriented and cooperative. Patient is in no acute distress.  Skin: Skin is warm and dry. No rash noted.   Cardiovascular: Normal heart rate noted  Respiratory: Normal respiratory effort, no problems with respiration noted  Abdomen: Soft, gravid, appropriate for gestational age.  Pain/Pressure: Absent     Pelvic: Cervical exam deferred        Extremities: Normal range of motion.  Edema: None  Mental Status: Normal mood and affect. Normal behavior. Normal judgment and thought content.   Assessment and Plan:  Pregnancy: G2P1001 at [redacted]w[redacted]d  1. Advanced maternal age in multigravida, second trimester Normal NIPT  2. Supervision of other normal pregnancy, antepartum 28 wk labs next visit  3. History of gestational hypertension Continue ASA  4. Hyperthyroidism in pregnancy, antepartum Repeat labs and send to Dr. Everardo All for change in med dosing and follow q 6 wks - TSH - T3, free - T4,  free  Preterm labor symptoms and general obstetric precautions including but not limited to vaginal bleeding, contractions, leaking of fluid and fetal movement were reviewed in detail with the patient. Please refer to After Visit Summary for other counseling recommendations.  Return in 3 weeks (on 12/29/2018) for 28 wk labs.  Future Appointments  Date Time Provider Department Center  12/28/2018  9:30 AM [facility] [place] 1 [facility]-US  12/29/2018  8:50 AM [facility] LAB [facility]  12/29/2018 10:15 AM Reva Bores, MD [facility]    Reva Bores, MD

## 2018-12-09 LAB — TSH: TSH: 0.118 u[IU]/mL — ABNORMAL LOW (ref 0.450–4.500)

## 2018-12-09 LAB — T4, FREE: Free T4: 1.13 ng/dL (ref 0.82–1.77)

## 2018-12-09 LAB — T3, FREE: T3 FREE: 2.6 pg/mL (ref 2.0–4.4)

## 2018-12-10 ENCOUNTER — Telehealth (HOSPITAL_COMMUNITY): Payer: Self-pay | Admitting: Obstetrics and Gynecology

## 2018-12-10 NOTE — Telephone Encounter (Signed)
Patient called to discuss her TFT results. She had consultation in Dec 2019 with Dr. Perry MountJacobson. I reviewed the results. Free T3 and Free T4 are within normal range. TSH suppression is still see. Patient does not have symptoms of palpitations or weight loss. She feels well. Fetal heart rates have been normal. I reassured her and advised her that there is no strong indication to start antithyroid medications now. However, I encouraged her to follow with her endocrinologist. She has an appointment with her endocrinologist in March.  She also has a follow-up fetal growth scan with us on 12/28/18.

## 2018-12-13 ENCOUNTER — Telehealth: Payer: Self-pay

## 2018-12-13 NOTE — Telephone Encounter (Signed)
Pt called requesting to have a referral for a different Endocrinologist.  Pt states that she is not comfortable with the one who is currently treating her.  Pt requested to be referred to a Dr. Talmage Nap @ [phone].  LM for their office to give [place] a call for a referral.  Heard on VM from office to fax referral records to [phone].

## 2018-12-14 NOTE — Telephone Encounter (Signed)
Penny Duffy, ext 101, from Dr. Talmage Nap office returned call and informed me that we just need to fax records to [phone] and their office will contact the pt with an appt and they will fax of [place] notification of appt.  Notified pt what their office informed me.  I also encouraged pt to call their office to let them know her insurance info due to our office not having her insurance card and they have requested to have sent with referral records.  Pt thank you and that she will give the office a call with info.  Front office to fax referral records information today.

## 2018-12-28 ENCOUNTER — Ambulatory Visit (HOSPITAL_COMMUNITY)
Admission: RE | Admit: 2018-12-28 | Discharge: 2018-12-28 | Disposition: A | Discharge status: Home / Self Care | Payer: Managed Care, Other (non HMO) | Source: Ambulatory Visit | Attending: Obstetrics and Gynecology | Admitting: Obstetrics and Gynecology

## 2018-12-28 ENCOUNTER — Other Ambulatory Visit (HOSPITAL_COMMUNITY): Payer: Self-pay | Admitting: *Deleted

## 2018-12-28 ENCOUNTER — Ambulatory Visit: Payer: Self-pay | Admitting: Endocrinology

## 2018-12-28 ENCOUNTER — Telehealth: Payer: Self-pay | Admitting: Endocrinology

## 2018-12-28 ENCOUNTER — Encounter (HOSPITAL_COMMUNITY): Payer: Self-pay

## 2018-12-28 DIAGNOSIS — O99283 Endocrine, nutritional and metabolic diseases complicating pregnancy, third trimester: Secondary | ICD-10-CM | POA: Diagnosis not present

## 2018-12-28 DIAGNOSIS — E0580 Other thyrotoxicosis without thyrotoxic crisis or storm: Secondary | ICD-10-CM

## 2018-12-28 DIAGNOSIS — Z362 Encounter for other antenatal screening follow-up: Secondary | ICD-10-CM | POA: Diagnosis not present

## 2018-12-28 DIAGNOSIS — E0590 Thyrotoxicosis, unspecified without thyrotoxic crisis or storm: Secondary | ICD-10-CM | POA: Insufficient documentation

## 2018-12-28 DIAGNOSIS — O99280 Endocrine, nutritional and metabolic diseases complicating pregnancy, unspecified trimester: Secondary | ICD-10-CM | POA: Insufficient documentation

## 2018-12-28 DIAGNOSIS — O09293 Supervision of pregnancy with other poor reproductive or obstetric history, third trimester: Secondary | ICD-10-CM

## 2018-12-28 DIAGNOSIS — O09523 Supervision of elderly multigravida, third trimester: Secondary | ICD-10-CM

## 2018-12-28 DIAGNOSIS — Z3A28 28 weeks gestation of pregnancy: Secondary | ICD-10-CM

## 2018-12-28 NOTE — Telephone Encounter (Signed)
Patient no showed today's appt. Please advise on how to follow up. °A. No follow up necessary. °B. Follow up urgent. Contact patient immediately. °C. Follow up necessary. Contact patient and schedule visit in ___ days. °D. Follow up advised. Contact patient and schedule visit in ____weeks. ° °Would you like the NS fee to be applied to this visit? ° °

## 2018-12-28 NOTE — Telephone Encounter (Signed)
Please refer to Dr. Ellison's response 

## 2018-12-28 NOTE — Telephone Encounter (Signed)
Rescheduled missed appointment for 12/30/18 at 1:45 p.m.

## 2018-12-28 NOTE — Telephone Encounter (Signed)
Please schedule f/u appt for next available appointment  

## 2018-12-29 ENCOUNTER — Other Ambulatory Visit: Payer: Self-pay | Admitting: *Deleted

## 2018-12-29 ENCOUNTER — Other Ambulatory Visit: Payer: Self-pay

## 2018-12-29 ENCOUNTER — Encounter: Payer: Self-pay | Admitting: Family Medicine

## 2018-12-29 DIAGNOSIS — Z8759 Personal history of other complications of pregnancy, childbirth and the puerperium: Secondary | ICD-10-CM

## 2018-12-29 DIAGNOSIS — E0590 Thyrotoxicosis, unspecified without thyrotoxic crisis or storm: Secondary | ICD-10-CM

## 2018-12-29 DIAGNOSIS — O99280 Endocrine, nutritional and metabolic diseases complicating pregnancy, unspecified trimester: Secondary | ICD-10-CM

## 2018-12-29 DIAGNOSIS — O09522 Supervision of elderly multigravida, second trimester: Secondary | ICD-10-CM

## 2018-12-29 DIAGNOSIS — Z3480 Encounter for supervision of other normal pregnancy, unspecified trimester: Secondary | ICD-10-CM

## 2018-12-30 ENCOUNTER — Telehealth: Payer: Self-pay | Admitting: Endocrinology

## 2018-12-30 ENCOUNTER — Other Ambulatory Visit: Payer: Managed Care, Other (non HMO)

## 2018-12-30 ENCOUNTER — Ambulatory Visit: Payer: Self-pay | Admitting: Endocrinology

## 2018-12-30 ENCOUNTER — Ambulatory Visit (INDEPENDENT_AMBULATORY_CARE_PROVIDER_SITE_OTHER): Payer: Managed Care, Other (non HMO) | Admitting: Obstetrics & Gynecology

## 2018-12-30 VITALS — BP 116/83 | HR 95 | Wt 183.5 lb

## 2018-12-30 DIAGNOSIS — Z3480 Encounter for supervision of other normal pregnancy, unspecified trimester: Secondary | ICD-10-CM

## 2018-12-30 DIAGNOSIS — O09522 Supervision of elderly multigravida, second trimester: Secondary | ICD-10-CM

## 2018-12-30 DIAGNOSIS — O99283 Endocrine, nutritional and metabolic diseases complicating pregnancy, third trimester: Secondary | ICD-10-CM

## 2018-12-30 DIAGNOSIS — E0590 Thyrotoxicosis, unspecified without thyrotoxic crisis or storm: Secondary | ICD-10-CM

## 2018-12-30 DIAGNOSIS — Z3483 Encounter for supervision of other normal pregnancy, third trimester: Secondary | ICD-10-CM

## 2018-12-30 DIAGNOSIS — Z8759 Personal history of other complications of pregnancy, childbirth and the puerperium: Secondary | ICD-10-CM

## 2018-12-30 DIAGNOSIS — O99280 Endocrine, nutritional and metabolic diseases complicating pregnancy, unspecified trimester: Secondary | ICD-10-CM

## 2018-12-30 DIAGNOSIS — O09523 Supervision of elderly multigravida, third trimester: Secondary | ICD-10-CM

## 2018-12-30 NOTE — Telephone Encounter (Signed)
Patient was seen at [facility] today and her labs were drawn- she wants to wait until she gets results before she scheduled a f/u- she is seeing maternal fetal MD there and after results are in she will know if she will come back here or continue care with fetal doctor

## 2018-12-30 NOTE — Telephone Encounter (Signed)
Patient no showed today's appt. Please advise on how to follow up. °A. No follow up necessary. °B. Follow up urgent. Contact patient immediately. °C. Follow up necessary. Contact patient and schedule visit in ___ days. °D. Follow up advised. Contact patient and schedule visit in ____weeks. ° °Would you like the NS fee to be applied to this visit? ° °

## 2018-12-30 NOTE — Progress Notes (Signed)
   PRENATAL VISIT NOTE  Subjective:  Penny Duffy is a 38 y.o. G2P1001 at [redacted]w[redacted]d being seen today for ongoing prenatal care.  She is currently monitored for the following issues for this high-risk pregnancy and has History of gestational hypertension; Hyperthyroidism; Supervision of other normal pregnancy, antepartum; Advanced maternal age in multigravida, second trimester; and Hyperthyroidism in pregnancy, antepartum on their problem list.  Patient reports no complaints.   .  .   . Denies leaking of fluid.   The following portions of the patient's history were reviewed and updated as appropriate: allergies, current medications, past family history, past medical history, past social history, past surgical history and problem list. Problem list updated.  Objective:  There were no vitals filed for this visit.  Fetal Status:           General:  Alert, oriented and cooperative. Patient is in no acute distress.  Skin: Skin is warm and dry. No rash noted.   Cardiovascular: Normal heart rate noted  Respiratory: Normal respiratory effort, no problems with respiration noted  Abdomen: Soft, gravid, appropriate for gestational age.        Pelvic: Cervical exam deferred        Extremities: Normal range of motion.     Mental Status: Normal mood and affect. Normal behavior. Normal judgment and thought content.   Assessment and Plan:  Pregnancy: G2P1001 at [redacted]w[redacted]d  1. Advanced maternal age in multigravida, second trimester - low risk NIPS  2. Supervision of other normal pregnancy, antepartum - 28 week labs today -she declines TDAP today  3. History of gestational hypertension - baby asa  4. Hyperthyroidism in pregnancy, antepartum - normal T4 11/19 - she had an appt with Dr. Lisabeth Devoid this past Tuesday, still rec's no meds   Preterm labor symptoms and general obstetric precautions including but not limited to vaginal bleeding, contractions, leaking of fluid and fetal movement were reviewed in  detail with the patient. Please refer to After Visit Summary for other counseling recommendations.  No follow-ups on file.  Future Appointments  Date Time Provider Department Center  12/30/2018  9:55 AM Allie Bossier, MD [facility]  12/30/2018  1:45 PM Romero Belling, MD [facility] None  02/08/2019  9:30 AM [facility] [place] 5 [facility]    Allie Bossier, MD

## 2018-12-30 NOTE — Telephone Encounter (Signed)
Please schedule f/u appt for next available appointment  

## 2018-12-31 LAB — RPR: RPR Ser Ql: NONREACTIVE

## 2018-12-31 LAB — CBC
Hematocrit: 34.3 % (ref 34.0–46.6)
Hemoglobin: 11.3 g/dL (ref 11.1–15.9)
MCH: 28.1 pg (ref 26.6–33.0)
MCHC: 32.9 g/dL (ref 31.5–35.7)
MCV: 85 fL (ref 79–97)
Platelets: 174 10*3/uL (ref 150–450)
RBC: 4.02 x10E6/uL (ref 3.77–5.28)
RDW: 14.3 % (ref 11.7–15.4)
WBC: 9.6 10*3/uL (ref 3.4–10.8)

## 2018-12-31 LAB — GLUCOSE TOLERANCE, 2 HOURS W/ 1HR
Glucose, 1 hour: 118 mg/dL (ref 65–179)
Glucose, 2 hour: 113 mg/dL (ref 65–152)
Glucose, Fasting: 83 mg/dL (ref 65–91)

## 2018-12-31 LAB — HIV ANTIBODY (ROUTINE TESTING W REFLEX): HIV SCREEN 4TH GENERATION: NONREACTIVE

## 2019-01-20 ENCOUNTER — Ambulatory Visit (INDEPENDENT_AMBULATORY_CARE_PROVIDER_SITE_OTHER): Payer: Managed Care, Other (non HMO) | Admitting: Obstetrics & Gynecology

## 2019-01-20 VITALS — Wt 185.3 lb

## 2019-01-20 DIAGNOSIS — Z3480 Encounter for supervision of other normal pregnancy, unspecified trimester: Secondary | ICD-10-CM

## 2019-01-20 DIAGNOSIS — E0590 Thyrotoxicosis, unspecified without thyrotoxic crisis or storm: Secondary | ICD-10-CM

## 2019-01-20 DIAGNOSIS — O99283 Endocrine, nutritional and metabolic diseases complicating pregnancy, third trimester: Secondary | ICD-10-CM

## 2019-01-20 DIAGNOSIS — O09522 Supervision of elderly multigravida, second trimester: Secondary | ICD-10-CM

## 2019-01-20 DIAGNOSIS — Z3483 Encounter for supervision of other normal pregnancy, third trimester: Secondary | ICD-10-CM

## 2019-01-20 DIAGNOSIS — Z3A31 31 weeks gestation of pregnancy: Secondary | ICD-10-CM

## 2019-01-20 DIAGNOSIS — O99280 Endocrine, nutritional and metabolic diseases complicating pregnancy, unspecified trimester: Secondary | ICD-10-CM

## 2019-01-20 DIAGNOSIS — Z8759 Personal history of other complications of pregnancy, childbirth and the puerperium: Secondary | ICD-10-CM

## 2019-01-20 NOTE — Progress Notes (Signed)
   PRENATAL VISIT NOTE  Subjective:  Penny Duffy is a 38 y.o. G2P1001 at [redacted]w[redacted]d being seen today for ongoing prenatal care.  She is currently monitored for the following issues for this high-risk pregnancy and has History of gestational hypertension; Hyperthyroidism; Supervision of other normal pregnancy, antepartum; Advanced maternal age in multigravida, second trimester; and Hyperthyroidism in pregnancy, antepartum on their problem list.  Patient reports no complaints.  Contractions: Not present. Vag. Bleeding: None.  Movement: Present. Denies leaking of fluid.   The following portions of the patient's history were reviewed and updated as appropriate: allergies, current medications, past family history, past medical history, past social history, past surgical history and problem list. Problem list updated.  Objective:   Vitals:   01/20/19 1041  Weight: 185 lb 4.8 oz (84.1 kg)    Fetal Status: Fetal Heart Rate (bpm): 151   Movement: Present     General:  Alert, oriented and cooperative. Patient is in no acute distress.  Skin: Skin is warm and dry. No rash noted.   Cardiovascular: Normal heart rate noted  Respiratory: Normal respiratory effort, no problems with respiration noted  Abdomen: Soft, gravid, appropriate for gestational age.  Pain/Pressure: Absent     Pelvic: Cervical exam deferred        Extremities: Normal range of motion.  Edema: None  Mental Status: Normal mood and affect. Normal behavior. Normal judgment and thought content.   Assessment and Plan:  Pregnancy: G2P1001 at [redacted]w[redacted]d  1. Advanced maternal age in multigravida, second trimester - low risk NIIPS  2. Supervision of other normal pregnancy, antepartum - follow up MFM u/s on 02/08/19  3. Hyperthyroidism in pregnancy, antepartum - normal free T3 and 4 last month  4. History of gestational hypertension - good BP on no meds - on baby asa  Preterm labor symptoms and general obstetric precautions including  but not limited to vaginal bleeding, contractions, leaking of fluid and fetal movement were reviewed in detail with the patient. Please refer to After Visit Summary for other counseling recommendations.  No follow-ups on file.  Future Appointments  Date Time Provider Department Center  02/08/2019  9:30 AM [facility] [place] [facility]    Allie Bossier, MD

## 2019-01-26 ENCOUNTER — Telehealth: Payer: Self-pay

## 2019-01-26 MED ORDER — TERCONAZOLE 0.4 % VA CREA: 1.0000 | TOPICAL_CREAM | Freq: Every day | VAGINAL | 0 refills | Status: DC

## 2019-01-26 NOTE — Telephone Encounter (Addendum)
Pt started taking antibiotics for bronchitis and she states that she now has a yeast infection discharge with itchiness.  Terazol cream e-prescribed per protocol.  Pt stated thank you with no further questions.

## 2019-02-02 ENCOUNTER — Encounter: Payer: Self-pay | Admitting: Obstetrics and Gynecology

## 2019-02-04 ENCOUNTER — Telehealth: Payer: Self-pay | Admitting: Lactation Services

## 2019-02-04 ENCOUNTER — Other Ambulatory Visit: Payer: Self-pay

## 2019-02-04 ENCOUNTER — Encounter: Payer: Self-pay | Admitting: Obstetrics and Gynecology

## 2019-02-04 ENCOUNTER — Ambulatory Visit (INDEPENDENT_AMBULATORY_CARE_PROVIDER_SITE_OTHER): Payer: Managed Care, Other (non HMO) | Admitting: Obstetrics and Gynecology

## 2019-02-04 VITALS — BP 130/89 | HR 100 | Wt 184.2 lb

## 2019-02-04 DIAGNOSIS — O99283 Endocrine, nutritional and metabolic diseases complicating pregnancy, third trimester: Secondary | ICD-10-CM

## 2019-02-04 DIAGNOSIS — E0590 Thyrotoxicosis, unspecified without thyrotoxic crisis or storm: Secondary | ICD-10-CM

## 2019-02-04 DIAGNOSIS — Z3480 Encounter for supervision of other normal pregnancy, unspecified trimester: Secondary | ICD-10-CM

## 2019-02-04 DIAGNOSIS — O09522 Supervision of elderly multigravida, second trimester: Secondary | ICD-10-CM

## 2019-02-04 DIAGNOSIS — Z3A33 33 weeks gestation of pregnancy: Secondary | ICD-10-CM

## 2019-02-04 DIAGNOSIS — Z8759 Personal history of other complications of pregnancy, childbirth and the puerperium: Secondary | ICD-10-CM

## 2019-02-04 DIAGNOSIS — O09523 Supervision of elderly multigravida, third trimester: Secondary | ICD-10-CM

## 2019-02-04 DIAGNOSIS — O99280 Endocrine, nutritional and metabolic diseases complicating pregnancy, unspecified trimester: Secondary | ICD-10-CM

## 2019-02-04 NOTE — Patient Instructions (Signed)
Use the following websites (and others) to help learn more about your contraception options and find the method that is right for you!  - The Centers for Disease Control (CDC) website: TransferLive.se  - [facility] website: https://www.[facility].org/learn/birth-control  - Beds[employer].org: https://www.bedsider.org/methods    Intrauterine Device Information An intrauterine device (IUD) is a medical device that is inserted in the uterus to prevent pregnancy. It is a small, T-shaped device that has one or two nylon strings hanging down from it. The strings hang out of the lower part of the uterus (cervix) to allow for future IUD removal. There are two types of IUDs available:  Hormone IUD. This type of IUD is made of plastic and contains the hormone progestin (synthetic progesterone). A hormone IUD may last 3-5 years.  Copper IUD. This type of IUD has copper wire wrapped around it. A copper IUD may last up to 10 years. How is an IUD inserted? An IUD is inserted through the vagina and placed into the uterus with a minor medical procedure. The exact procedure for IUD insertion may vary among health care providers and hospitals. How does an IUD work? Synthetic progesterone in a hormonal IUD prevents pregnancy by:  Thickening cervical mucus to prevent sperm from entering the uterus.  Thinning the uterine lining to prevent a fertilized egg from being implanted there. Copper in a copper IUD prevents pregnancy by making the uterus and fallopian tubes produce a fluid that kills sperm. What are the advantages of an IUD? Advantages of either type of IUD  It is highly effective in preventing pregnancy.  It is reversible. You can become pregnant shortly after the IUD is removed.  It is low-maintenance and can stay in place for a long time.  There are no estrogen-related side effects.  It can be used when breastfeeding.  It is not  associated with weight gain.  It can be inserted right after childbirth, an abortion, or a miscarriage. Advantages of a hormone IUD  If it is inserted within 7 days of your period starting, it works right after it is inserted. If the hormone IUD is inserted at any other time in your cycle, you will need to use a backup method of birth control for 7 days after insertion.  It can make menstrual periods lighter.  It can reduce menstrual cramping.  It can be used for 3-5 years. Advantages of a copper IUD  It works right after it is inserted.  It can be used as a form of emergency birth control if it is inserted within 5 days after having unprotected sex.  It does not interfere with your body's natural hormones.  It can be used for 10 years. What are the disadvantages of an IUD?  An IUD may cause irregular menstrual bleeding for a period of time after insertion.  You may have pain during insertion and have cramping and vaginal bleeding after insertion.  An IUD may cut the uterus (uterine perforation) when it is inserted. This is rare.  An IUD may cause pelvic inflammatory disease (PID), which is an infection in the uterus and fallopian tubes. This is rare, and it usually happens during the first 20 days after the IUD is inserted.  A copper IUD can make your menstrual flow heavier and more painful. How is an IUD removed?  You will lie on your back with your knees bent and your feet in footrests (stirrups).  A device will be inserted into your vagina to spread apart the vaginal  walls (speculum). This will allow your health care provider to see the strings attached to the IUD.  Your health care provider will use a small instrument (forceps) to grasp the IUD strings and pull firmly until the IUD is removed. You may have some discomfort when the IUD is removed. Your health care provider may recommend taking over-the-counter pain relievers, such as ibuprofen, before the procedure. You may  also have minor spotting for a few days after the procedure. The exact procedure for IUD removal may vary among health care providers and hospitals. Is the IUD right for me? Your health care provider will make sure you are a good candidate for an IUD and will discuss the advantages, disadvantages, and possible side effects with you. Summary  An intrauterine device (IUD) is a medical device that is inserted in the uterus to prevent pregnancy. It is a small, T-shaped device that has one or two nylon strings hanging down from it.  A hormone IUD contains the hormone progestin (synthetic progesterone). A copper IUD has copper wire wrapped around it.  Synthetic progesterone in a hormone IUD prevents pregnancy by thickening cervical mucus and thinning the walls of the uterus. Copper in a copper IUD prevents pregnancy by making the uterus and fallopian tubes produce a fluid that kills sperm.  A hormone IUD can be left in place for 3-5 years. A copper IUD can be left in place for up to 10 years.  An IUD is inserted and removed by a health care provider. You may feel some pain during insertion and removal. Your health care provider may recommend taking over-the-counter pain medicine, such as ibuprofen, before an IUD procedure. This information is not intended to replace advice given to you by your health care provider. Make sure you discuss any questions you have with your health care provider. Document Released: 10/14/2004 Document Revised: 12/09/2016 Document Reviewed: 12/09/2016 Elsevier Interactive Patient Education  2019 [employer]. Etonogestrel implant What is this medicine? ETONOGESTREL (et oh noe JES trel) is a contraceptive (birth control) device. It is used to prevent pregnancy. It can be used for up to 3 years. This medicine may be used for other purposes; ask your health care provider or pharmacist if you have questions. COMMON BRAND NAME(S): Implanon, Nexplanon What should I tell my  health care provider before I take this medicine? They need to know if you have any of these conditions: -abnormal vaginal bleeding -blood vessel disease or blood clots -breast, cervical, endometrial, ovarian, liver, or uterine cancer -diabetes -gallbladder disease -heart disease or recent heart attack -high blood pressure -high cholesterol or triglycerides -kidney disease -liver disease -migraine headaches -seizures -stroke -tobacco smoker -an unusual or allergic reaction to etonogestrel, anesthetics or antiseptics, other medicines, foods, dyes, or preservatives -pregnant or trying to get pregnant -breast-feeding How should I use this medicine? This device is inserted just under the skin on the inner side of your upper arm by a health care professional. Talk to your pediatrician regarding the use of this medicine in children. Special care may be needed. Overdosage: If you think you have taken too much of this medicine contact a poison control center or emergency room at once. NOTE: This medicine is only for you. Do not share this medicine with others. What if I miss a dose? This does not apply. What may interact with this medicine? Do not take this medicine with any of the following medications: -amprenavir -fosamprenavir This medicine may also interact with the following medications: -acitretin -aprepitant -  armodafinil -bexarotene -bosentan -carbamazepine -certain medicines for fungal infections like fluconazole, ketoconazole, itraconazole and voriconazole -certain medicines to treat hepatitis, HIV or AIDS -cyclosporine -felbamate -griseofulvin -lamotrigine -modafinil -oxcarbazepine -phenobarbital -phenytoin -primidone -rifabutin -rifampin -rifapentine -St. John's wort -topiramate This list may not describe all possible interactions. Give your health care provider a list of all the medicines, herbs, non-prescription drugs, or dietary supplements you use. Also  tell them if you smoke, drink alcohol, or use illegal drugs. Some items may interact with your medicine. What should I watch for while using this medicine? This product does not protect you against HIV infection (AIDS) or other sexually transmitted diseases. You should be able to feel the implant by pressing your fingertips over the skin where it was inserted. Contact your doctor if you cannot feel the implant, and use a non-hormonal birth control method (such as condoms) until your doctor confirms that the implant is in place. Contact your doctor if you think that the implant may have broken or become bent while in your arm. You will receive a user card from your health care provider after the implant is inserted. The card is a record of the location of the implant in your upper arm and when it should be removed. Keep this card with your health records. What side effects may I notice from receiving this medicine? Side effects that you should report to your doctor or health care professional as soon as possible: -allergic reactions like skin rash, itching or hives, swelling of the face, lips, or tongue -breast lumps, breast tissue changes, or discharge -breathing problems -changes in emotions or moods -if you feel that the implant may have broken or bent while in your arm -high blood pressure -pain, irritation, swelling, or bruising at the insertion site -scar at site of insertion -signs of infection at the insertion site such as fever, and skin redness, pain or discharge -signs and symptoms of a blood clot such as breathing problems; changes in vision; chest pain; severe, sudden headache; pain, swelling, warmth in the leg; trouble speaking; sudden numbness or weakness of the face, arm or leg -signs and symptoms of liver injury like dark yellow or brown urine; general ill feeling or flu-like symptoms; light-colored stools; loss of appetite; nausea; right upper belly pain; unusually weak or tired;  yellowing of the eyes or skin -unusual vaginal bleeding, discharge Side effects that usually do not require medical attention (report to your doctor or health care professional if they continue or are bothersome): -acne -breast pain or tenderness -headache -irregular menstrual bleeding -nausea This list may not describe all possible side effects. Call your doctor for medical advice about side effects. You may report side effects to FDA at 1-800-FDA-1088. Where should I keep my medicine? This drug is given in a hospital or clinic and will not be stored at home. NOTE: This sheet is a summary. It may not cover all possible information. If you have questions about this medicine, talk to your doctor, pharmacist, or health care provider.  2019 Elsevier/Gold Standard (2017-09-29 14:11:42)

## 2019-02-04 NOTE — Telephone Encounter (Signed)
Spoke with mom while at [facility] visit. Mom asked about obtaining pump through insurance, questions answered.   Mom reports she had milk supply issues with her older child. Her older daughter was in the NICU for 1 week and was born at 27 weeks. Mom with history of HTN with first and this pregnancy. Mom now diagnosed with Hyperthyroidism that is being treated.   Discussed with mom importance of early hand expression and pumping to stimulate milk production.   Mom reports she has no further questions at this time.

## 2019-02-04 NOTE — Progress Notes (Signed)
   PRENATAL VISIT NOTE  Subjective:  Penny Duffy is a 38 y.o. G2P1001 at [redacted]w[redacted]d being seen today for ongoing prenatal care.  She is currently monitored for the following issues for this high-risk pregnancy and has History of gestational hypertension; Hyperthyroidism; Supervision of other normal pregnancy, antepartum; Advanced maternal age in multigravida, second trimester; and Hyperthyroidism in pregnancy, antepartum on their problem list.  Patient reports no complaints. She is feeling fatigued. Occasionally feels short of breath with exertion. Contractions: Not present. Vag. Bleeding: None.  Movement: Present. Denies leaking of fluid.   The following portions of the patient's history were reviewed and updated as appropriate: allergies, current medications, past family history, past medical history, past social history, past surgical history and problem list.   Objective:   Vitals:   02/04/19 0949  BP: 130/89  Pulse: 100  Weight: 184 lb 3.2 oz (83.6 kg)   Fetal Status: Fetal Heart Rate (bpm): 140   Movement: Present     General:  Alert, oriented and cooperative. Patient is in no acute distress.  Skin: Skin is warm and dry. No rash noted.   Cardiovascular: Normal heart rate noted  Respiratory: Normal respiratory effort, no problems with respiration noted  Abdomen: Soft, gravid, appropriate for gestational age.  Pain/Pressure: Present     Pelvic: Cervical exam deferred        Extremities: Normal range of motion.  Edema: None  Mental Status: Normal mood and affect. Normal behavior. Normal judgment and thought content.   Assessment and Plan:  Pregnancy: G2P1001 at [redacted]w[redacted]d 1. Supervision of other normal pregnancy, antepartum Reviewed contraception options, gave info  2. Advanced maternal age in multigravida, second trimester  3. History of gestational hypertension BP stable  Next growth 02/08/19  4. Hyperthyroidism in pregnancy, antepartum Graves disease, has been followed with  endocrine, no meds currently Has been getting labs with endo, repeat labs with endocrine 02/23/19   Preterm labor symptoms and general obstetric precautions including but not limited to vaginal bleeding, contractions, leaking of fluid and fetal movement were reviewed in detail with the patient. Please refer to After Visit Summary for other counseling recommendations.   Return in about 2 weeks (around 02/18/2019) for OB visit (MD).  Future Appointments  Date Time Provider Department Center  02/08/2019  9:15 AM [facility] NURSE [facility]  02/08/2019  9:30 AM [facility] [place] 5 [facility]    Conan Bowens, MD

## 2019-02-08 ENCOUNTER — Ambulatory Visit (HOSPITAL_COMMUNITY)
Admission: RE | Admit: 2019-02-08 | Discharge: 2019-02-08 | Disposition: A | Discharge status: Home / Self Care | Payer: Managed Care, Other (non HMO) | Source: Ambulatory Visit | Attending: Obstetrics and Gynecology | Admitting: Obstetrics and Gynecology

## 2019-02-08 ENCOUNTER — Other Ambulatory Visit: Payer: Self-pay

## 2019-02-08 ENCOUNTER — Encounter: Payer: Self-pay | Admitting: *Deleted

## 2019-02-08 ENCOUNTER — Other Ambulatory Visit (HOSPITAL_COMMUNITY): Payer: Self-pay | Admitting: *Deleted

## 2019-02-08 ENCOUNTER — Ambulatory Visit (HOSPITAL_COMMUNITY): Payer: Managed Care, Other (non HMO) | Admitting: *Deleted

## 2019-02-08 ENCOUNTER — Encounter (HOSPITAL_COMMUNITY): Payer: Self-pay

## 2019-02-08 VITALS — BP 133/80 | HR 125 | Wt 187.6 lb

## 2019-02-08 DIAGNOSIS — O99283 Endocrine, nutritional and metabolic diseases complicating pregnancy, third trimester: Secondary | ICD-10-CM | POA: Diagnosis not present

## 2019-02-08 DIAGNOSIS — E0590 Thyrotoxicosis, unspecified without thyrotoxic crisis or storm: Secondary | ICD-10-CM | POA: Diagnosis not present

## 2019-02-08 DIAGNOSIS — O09523 Supervision of elderly multigravida, third trimester: Secondary | ICD-10-CM

## 2019-02-08 DIAGNOSIS — E0580 Other thyrotoxicosis without thyrotoxic crisis or storm: Secondary | ICD-10-CM | POA: Insufficient documentation

## 2019-02-08 DIAGNOSIS — O09293 Supervision of pregnancy with other poor reproductive or obstetric history, third trimester: Secondary | ICD-10-CM | POA: Diagnosis not present

## 2019-02-08 DIAGNOSIS — O99280 Endocrine, nutritional and metabolic diseases complicating pregnancy, unspecified trimester: Secondary | ICD-10-CM | POA: Insufficient documentation

## 2019-02-08 DIAGNOSIS — Z3A34 34 weeks gestation of pregnancy: Secondary | ICD-10-CM

## 2019-02-15 ENCOUNTER — Ambulatory Visit (HOSPITAL_COMMUNITY)
Admission: RE | Admit: 2019-02-15 | Discharge: 2019-02-15 | Disposition: A | Discharge status: Home / Self Care | Payer: Managed Care, Other (non HMO) | Source: Ambulatory Visit | Attending: Obstetrics and Gynecology | Admitting: Obstetrics and Gynecology

## 2019-02-15 ENCOUNTER — Encounter (HOSPITAL_COMMUNITY): Payer: Self-pay

## 2019-02-15 ENCOUNTER — Other Ambulatory Visit: Payer: Self-pay

## 2019-02-15 ENCOUNTER — Ambulatory Visit (HOSPITAL_COMMUNITY): Payer: Managed Care, Other (non HMO) | Admitting: *Deleted

## 2019-02-15 VITALS — BP 130/87 | HR 127 | Temp 97.8°F

## 2019-02-15 DIAGNOSIS — E0590 Thyrotoxicosis, unspecified without thyrotoxic crisis or storm: Secondary | ICD-10-CM

## 2019-02-15 DIAGNOSIS — O99283 Endocrine, nutritional and metabolic diseases complicating pregnancy, third trimester: Secondary | ICD-10-CM

## 2019-02-15 DIAGNOSIS — O09522 Supervision of elderly multigravida, second trimester: Secondary | ICD-10-CM | POA: Diagnosis present

## 2019-02-15 DIAGNOSIS — O09523 Supervision of elderly multigravida, third trimester: Secondary | ICD-10-CM

## 2019-02-15 DIAGNOSIS — O0990 Supervision of high risk pregnancy, unspecified, unspecified trimester: Secondary | ICD-10-CM | POA: Insufficient documentation

## 2019-02-15 DIAGNOSIS — O99280 Endocrine, nutritional and metabolic diseases complicating pregnancy, unspecified trimester: Secondary | ICD-10-CM | POA: Diagnosis present

## 2019-02-15 DIAGNOSIS — O09293 Supervision of pregnancy with other poor reproductive or obstetric history, third trimester: Secondary | ICD-10-CM

## 2019-02-15 DIAGNOSIS — Z3A35 35 weeks gestation of pregnancy: Secondary | ICD-10-CM

## 2019-02-15 NOTE — Progress Notes (Signed)
Pt reports seasonal allergies.  Mask applied during visit.

## 2019-02-21 ENCOUNTER — Telehealth: Payer: Self-pay | Admitting: Obstetrics and Gynecology

## 2019-02-21 NOTE — Telephone Encounter (Signed)
Informed the patient of the change in appointment time and date. The patient agreed to the new time. Inform of changes due to COVID19.

## 2019-02-21 NOTE — Telephone Encounter (Signed)
Postponed the visit per provider. Dr. Marice Potter and [facility] are the only options available for the week the patient is being scheduled.

## 2019-02-22 ENCOUNTER — Encounter (HOSPITAL_COMMUNITY): Payer: Self-pay

## 2019-02-22 ENCOUNTER — Encounter: Payer: Self-pay | Admitting: Family Medicine

## 2019-02-22 ENCOUNTER — Ambulatory Visit (HOSPITAL_COMMUNITY): Payer: Managed Care, Other (non HMO) | Admitting: *Deleted

## 2019-02-22 ENCOUNTER — Ambulatory Visit (HOSPITAL_COMMUNITY)
Admission: RE | Admit: 2019-02-22 | Discharge: 2019-02-22 | Disposition: A | Discharge status: Home / Self Care | Payer: Managed Care, Other (non HMO) | Source: Ambulatory Visit | Attending: Obstetrics and Gynecology | Admitting: Obstetrics and Gynecology

## 2019-02-22 ENCOUNTER — Other Ambulatory Visit: Payer: Self-pay

## 2019-02-22 VITALS — BP 117/91 | HR 103 | Temp 98.1°F

## 2019-02-22 DIAGNOSIS — O99283 Endocrine, nutritional and metabolic diseases complicating pregnancy, third trimester: Secondary | ICD-10-CM

## 2019-02-22 DIAGNOSIS — O0990 Supervision of high risk pregnancy, unspecified, unspecified trimester: Secondary | ICD-10-CM | POA: Insufficient documentation

## 2019-02-22 DIAGNOSIS — Z3A36 36 weeks gestation of pregnancy: Secondary | ICD-10-CM

## 2019-02-22 DIAGNOSIS — O09293 Supervision of pregnancy with other poor reproductive or obstetric history, third trimester: Secondary | ICD-10-CM

## 2019-02-22 DIAGNOSIS — O09523 Supervision of elderly multigravida, third trimester: Secondary | ICD-10-CM

## 2019-03-01 ENCOUNTER — Ambulatory Visit (HOSPITAL_COMMUNITY): Payer: Managed Care, Other (non HMO)

## 2019-03-03 ENCOUNTER — Encounter: Payer: Self-pay | Admitting: Obstetrics & Gynecology

## 2019-03-08 ENCOUNTER — Ambulatory Visit (HOSPITAL_BASED_OUTPATIENT_CLINIC_OR_DEPARTMENT_OTHER)
Admission: RE | Admit: 2019-03-08 | Discharge: 2019-03-08 | Disposition: A | Discharge status: Home / Self Care | Payer: Managed Care, Other (non HMO) | Source: Ambulatory Visit | Attending: Obstetrics and Gynecology | Admitting: Obstetrics and Gynecology

## 2019-03-08 ENCOUNTER — Other Ambulatory Visit: Payer: Self-pay | Admitting: Obstetrics and Gynecology

## 2019-03-08 ENCOUNTER — Telehealth (HOSPITAL_COMMUNITY): Payer: Self-pay | Admitting: *Deleted

## 2019-03-08 ENCOUNTER — Encounter: Payer: Self-pay | Admitting: Obstetrics & Gynecology

## 2019-03-08 ENCOUNTER — Ambulatory Visit (HOSPITAL_COMMUNITY): Payer: Managed Care, Other (non HMO) | Admitting: *Deleted

## 2019-03-08 ENCOUNTER — Other Ambulatory Visit: Payer: Self-pay

## 2019-03-08 ENCOUNTER — Encounter (HOSPITAL_COMMUNITY): Payer: Self-pay | Admitting: *Deleted

## 2019-03-08 ENCOUNTER — Encounter (HOSPITAL_COMMUNITY): Payer: Self-pay

## 2019-03-08 VITALS — BP 133/98 | HR 105 | Temp 98.1°F

## 2019-03-08 DIAGNOSIS — O09523 Supervision of elderly multigravida, third trimester: Secondary | ICD-10-CM

## 2019-03-08 DIAGNOSIS — Z3483 Encounter for supervision of other normal pregnancy, third trimester: Secondary | ICD-10-CM

## 2019-03-08 DIAGNOSIS — O99283 Endocrine, nutritional and metabolic diseases complicating pregnancy, third trimester: Secondary | ICD-10-CM | POA: Diagnosis not present

## 2019-03-08 DIAGNOSIS — O09293 Supervision of pregnancy with other poor reproductive or obstetric history, third trimester: Secondary | ICD-10-CM | POA: Diagnosis not present

## 2019-03-08 DIAGNOSIS — Z362 Encounter for other antenatal screening follow-up: Secondary | ICD-10-CM

## 2019-03-08 DIAGNOSIS — E0590 Thyrotoxicosis, unspecified without thyrotoxic crisis or storm: Secondary | ICD-10-CM | POA: Diagnosis not present

## 2019-03-08 DIAGNOSIS — O134 Gestational [pregnancy-induced] hypertension without significant proteinuria, complicating childbirth: Secondary | ICD-10-CM | POA: Diagnosis not present

## 2019-03-08 DIAGNOSIS — Z3A38 38 weeks gestation of pregnancy: Secondary | ICD-10-CM

## 2019-03-08 NOTE — Telephone Encounter (Signed)
Preadmission screen  

## 2019-03-09 ENCOUNTER — Other Ambulatory Visit: Payer: Self-pay

## 2019-03-09 ENCOUNTER — Encounter (HOSPITAL_COMMUNITY): Payer: Self-pay

## 2019-03-09 ENCOUNTER — Inpatient Hospital Stay (HOSPITAL_COMMUNITY): Admission: RE | Admit: 2019-03-09 | Payer: Managed Care, Other (non HMO) | Source: Ambulatory Visit

## 2019-03-09 ENCOUNTER — Other Ambulatory Visit: Payer: Self-pay | Admitting: Family Medicine

## 2019-03-09 ENCOUNTER — Ambulatory Visit (HOSPITAL_COMMUNITY): Payer: Managed Care, Other (non HMO)

## 2019-03-09 ENCOUNTER — Other Ambulatory Visit (HOSPITAL_COMMUNITY): Payer: Managed Care, Other (non HMO)

## 2019-03-09 ENCOUNTER — Inpatient Hospital Stay (HOSPITAL_COMMUNITY)
Admission: AD | Admit: 2019-03-09 | Discharge: 2019-03-12 | DRG: 807 | Disposition: A | Discharge status: Home / Self Care | Payer: Managed Care, Other (non HMO) | Attending: Obstetrics & Gynecology | Admitting: Obstetrics & Gynecology

## 2019-03-09 DIAGNOSIS — O99280 Endocrine, nutritional and metabolic diseases complicating pregnancy, unspecified trimester: Secondary | ICD-10-CM

## 2019-03-09 DIAGNOSIS — O99824 Streptococcus B carrier state complicating childbirth: Secondary | ICD-10-CM | POA: Diagnosis not present

## 2019-03-09 DIAGNOSIS — Z87891 Personal history of nicotine dependence: Secondary | ICD-10-CM | POA: Diagnosis not present

## 2019-03-09 DIAGNOSIS — O09522 Supervision of elderly multigravida, second trimester: Secondary | ICD-10-CM | POA: Diagnosis present

## 2019-03-09 DIAGNOSIS — O134 Gestational [pregnancy-induced] hypertension without significant proteinuria, complicating childbirth: Secondary | ICD-10-CM | POA: Diagnosis present

## 2019-03-09 DIAGNOSIS — O99284 Endocrine, nutritional and metabolic diseases complicating childbirth: Secondary | ICD-10-CM | POA: Diagnosis present

## 2019-03-09 DIAGNOSIS — O139 Gestational [pregnancy-induced] hypertension without significant proteinuria, unspecified trimester: Secondary | ICD-10-CM | POA: Diagnosis present

## 2019-03-09 DIAGNOSIS — Z9189 Other specified personal risk factors, not elsewhere classified: Secondary | ICD-10-CM

## 2019-03-09 DIAGNOSIS — O99214 Obesity complicating childbirth: Secondary | ICD-10-CM | POA: Diagnosis present

## 2019-03-09 DIAGNOSIS — Z3A38 38 weeks gestation of pregnancy: Secondary | ICD-10-CM

## 2019-03-09 DIAGNOSIS — E0590 Thyrotoxicosis, unspecified without thyrotoxic crisis or storm: Secondary | ICD-10-CM | POA: Diagnosis present

## 2019-03-09 DIAGNOSIS — Z3490 Encounter for supervision of normal pregnancy, unspecified, unspecified trimester: Secondary | ICD-10-CM

## 2019-03-09 LAB — COMPREHENSIVE METABOLIC PANEL
ALT: 10 U/L (ref 0–44)
AST: 16 U/L (ref 15–41)
Albumin: 2.2 g/dL — ABNORMAL LOW (ref 3.5–5.0)
Alkaline Phosphatase: 155 U/L — ABNORMAL HIGH (ref 38–126)
Anion gap: 9 (ref 5–15)
BUN: 6 mg/dL (ref 6–20)
CO2: 20 mmol/L — ABNORMAL LOW (ref 22–32)
Calcium: 8.7 mg/dL — ABNORMAL LOW (ref 8.9–10.3)
Chloride: 105 mmol/L (ref 98–111)
Creatinine, Ser: 0.47 mg/dL (ref 0.44–1.00)
GFR calc Af Amer: >60 mL/min (ref >60)
GFR calc non Af Amer: >60 mL/min (ref >60)
Glucose, Bld: 100 mg/dL — ABNORMAL HIGH (ref 70–99)
Potassium: 3.9 mmol/L (ref 3.5–5.1)
Sodium: 134 mmol/L — ABNORMAL LOW (ref 135–145)
Total Bilirubin: 0.5 mg/dL (ref 0.3–1.2)
Total Protein: 5.8 g/dL — ABNORMAL LOW (ref 6.5–8.1)

## 2019-03-09 LAB — CBC
HCT: 35.7 % — ABNORMAL LOW (ref 36.0–46.0)
Hemoglobin: 11.8 g/dL — ABNORMAL LOW (ref 12.0–15.0)
MCH: 28.4 pg (ref 26.0–34.0)
MCHC: 33.1 g/dL (ref 30.0–36.0)
MCV: 85.8 fL (ref 80.0–100.0)
Platelets: 177 10*3/uL (ref 150–400)
RBC: 4.16 MIL/uL (ref 3.87–5.11)
RDW: 15.5 % (ref 11.5–15.5)
WBC: 12.4 10*3/uL — ABNORMAL HIGH (ref 4.0–10.5)
nRBC: 0.2 % (ref 0.0–0.2)

## 2019-03-09 LAB — TYPE AND SCREEN
ABO/RH(D): O POS
Antibody Screen: NEGATIVE

## 2019-03-09 LAB — PROTEIN / CREATININE RATIO, URINE
Creatinine, Urine: 122.31 mg/dL
Protein Creatinine Ratio: 0.2 mg/mg{Cre} — ABNORMAL HIGH (ref 0.00–0.15)
Total Protein, Urine: 24 mg/dL

## 2019-03-09 LAB — ABO/RH: ABO/RH(D): O POS

## 2019-03-09 LAB — TSH: TSH: <0.01 u[IU]/mL — ABNORMAL LOW (ref 0.350–4.500)

## 2019-03-09 LAB — OB RESULTS CONSOLE GBS: GBS: NEGATIVE

## 2019-03-09 LAB — GROUP B STREP BY PCR: Group B strep by PCR: NEGATIVE

## 2019-03-09 MED ORDER — LABETALOL HCL 5 MG/ML IV SOLN: 20.0000 mg | INTRAVENOUS | Status: DC | PRN

## 2019-03-09 MED ORDER — LABETALOL HCL 5 MG/ML IV SOLN: 80.0000 mg | INTRAVENOUS | Status: DC | PRN

## 2019-03-09 MED ORDER — TERBUTALINE SULFATE 1 MG/ML IJ SOLN: 0.2500 mg | Freq: Once | INTRAMUSCULAR | Status: DC | PRN

## 2019-03-09 MED ORDER — LIDOCAINE HCL (PF) 1 % IJ SOLN
30.0000 mL | INTRAMUSCULAR | Status: AC | PRN
  Administered 2019-03-10: 30 mL via SUBCUTANEOUS
  Filled 2019-03-09 (×2): qty 30

## 2019-03-09 MED ORDER — LACTATED RINGERS IV SOLN
500.0000 mL | INTRAVENOUS | Status: DC | PRN
  Administered 2019-03-10: 500 mL via INTRAVENOUS

## 2019-03-09 MED ORDER — OXYTOCIN 40 UNITS IN NORMAL SALINE INFUSION - SIMPLE MED: 2.5000 [IU]/h | INTRAVENOUS | Status: DC

## 2019-03-09 MED ORDER — LABETALOL HCL 5 MG/ML IV SOLN: 40.0000 mg | INTRAVENOUS | Status: DC | PRN

## 2019-03-09 MED ORDER — PROPYLTHIOURACIL 50 MG PO TABS
50.0000 mg | ORAL_TABLET | Freq: Two times a day (BID) | ORAL | Status: DC
  Administered 2019-03-09 – 2019-03-10 (×2): 50 mg via ORAL
  Filled 2019-03-09 (×4): qty 1

## 2019-03-09 MED ORDER — MISOPROSTOL 50MCG HALF TABLET
50.0000 ug | ORAL_TABLET | ORAL | Status: DC | PRN
  Administered 2019-03-09: 50 ug via BUCCAL
  Filled 2019-03-09: qty 1

## 2019-03-09 MED ORDER — OXYTOCIN BOLUS FROM INFUSION
500.0000 mL | Freq: Once | INTRAVENOUS | Status: AC
  Administered 2019-03-10: 500 mL via INTRAVENOUS

## 2019-03-09 MED ORDER — SODIUM CHLORIDE 0.9 % IV SOLN
5.0000 10*6.[IU] | Freq: Once | INTRAVENOUS | Status: AC
  Administered 2019-03-09: 5 10*6.[IU] via INTRAVENOUS
  Filled 2019-03-09: qty 5

## 2019-03-09 MED ORDER — ACETAMINOPHEN 325 MG PO TABS: 650.0000 mg | ORAL_TABLET | ORAL | Status: DC | PRN

## 2019-03-09 MED ORDER — LACTATED RINGERS IV SOLN
INTRAVENOUS | Status: DC
  Administered 2019-03-09 – 2019-03-10 (×6): via INTRAVENOUS

## 2019-03-09 MED ORDER — PENICILLIN G 3 MILLION UNITS IVPB - SIMPLE MED
3.0000 10*6.[IU] | INTRAVENOUS | Status: DC
  Filled 2019-03-09 (×10): qty 100

## 2019-03-09 MED ORDER — FENTANYL CITRATE (PF) 100 MCG/2ML IJ SOLN
50.0000 ug | Freq: Once | INTRAMUSCULAR | Status: AC
  Administered 2019-03-09: 50 ug via INTRAVENOUS
  Filled 2019-03-09: qty 2

## 2019-03-09 MED ORDER — SOD CITRATE-CITRIC ACID 500-334 MG/5ML PO SOLN: 30.0000 mL | ORAL | Status: DC | PRN

## 2019-03-09 MED ORDER — ONDANSETRON HCL 4 MG/2ML IJ SOLN
4.0000 mg | Freq: Four times a day (QID) | INTRAMUSCULAR | Status: DC | PRN
  Administered 2019-03-09 – 2019-03-10 (×3): 4 mg via INTRAVENOUS
  Filled 2019-03-09 (×3): qty 2

## 2019-03-09 MED ORDER — HYDRALAZINE HCL 20 MG/ML IJ SOLN: 10.0000 mg | INTRAMUSCULAR | Status: DC | PRN

## 2019-03-09 MED ORDER — OXYCODONE-ACETAMINOPHEN 5-325 MG PO TABS: 2.0000 | ORAL_TABLET | ORAL | Status: DC | PRN

## 2019-03-09 MED ORDER — OXYCODONE-ACETAMINOPHEN 5-325 MG PO TABS: 1.0000 | ORAL_TABLET | ORAL | Status: DC | PRN

## 2019-03-09 NOTE — Progress Notes (Signed)
[facility]N [facility]: Labor Progress Note  Subjective: Doing well, feels like contractions are about every 1-2 minutes. Bearable but uncomfortable. FB still in place - has been up to go to bathroom a couple times.   Objective: BP (!) 148/91   Pulse 79   Temp 98 F (36.7 C) (Oral)   Resp 20   Ht 5\' 1"  (1.549 m)   Wt 84.4 kg   LMP 06/07/2018   SpO2 99%   BMI 35.14 kg/m  Gen: exophthalmos R eye  mildly uncomfortable appearing Dilation: 1.5 Effacement (%): 70 Cervical Position: Posterior Station: -2 Presentation: Vertex Exam by:: Drenda Freeze, CNM  Assessment and Plan: 38 y.o. G2P1001 [redacted]w[redacted]d here for IOL for gHTN, hyperthyroid.  Labor:  Induction started with cytotec this afternoon - has only received one dose and continuing to contract uncomfortably every 1-2 minutes. FB placed later this evening. Will defer further induction medications at this time to avoid tachysystole.  -- pain control: received fentanyl, open to epidural  -- PPH Risk: low  Fetal Well-Being: EFW 3941g >90% at 38w0. Cephalic by sutures on prior checks.  -- Category I - continuous fetal monitoring  -- GBS negative   GHTN: UPC 0.20, labs wnl on admission. Asymptomatic at this time. BP moderate range. -- continue to monitor closely   Penny Duffy S. Earlene Plater, DO OB/GYN Fellow, Faculty Practice  11:06 PM

## 2019-03-09 NOTE — Progress Notes (Signed)
Pt received one dose of PCN for GBS unknown. However, further research into chart revealed that pt did not have GBS w/prior pg, so no indication for prophylaxis.  PCN d/c'd.

## 2019-03-09 NOTE — Progress Notes (Signed)
    Patient Vitals for the past 6 hrs:  BP Temp Temp src Pulse Resp Height Weight  03/09/19 1755 - - - - 18 - -  03/09/19 1750 - - - - 20 - -  03/09/19 1738 134/88 - - 92 18 - -  03/09/19 1611 (!) 148/82 - - 80 20 - -  03/09/19 1537 - - - - 18 - -  03/09/19 1511 - - - - - 5\' 1"  (1.549 m) 84.4 kg  03/09/19 1503 (!) 145/93 98.1 F (36.7 C) Oral 80 20 - -  03/09/19 1433 128/82 - - 100 - - -  03/09/19 1330 (!) 127/98 - - (!) 103 - - -  03/09/19 1315 131/82 - - 94 - - -  03/09/19 1300 (!) 112/98 - - (!) 106 - - -  03/09/19 1245 (!) 142/84 - - 94 - - -  03/09/19 1230 (!) 136/94 - - 98 - - -    FHR cat 1.  Mild ctx q 2-3 minutes.  Foley inserted and inflated w/60cc H20.  Pt premedicated d/t intolerance of vaginal exams/speculums. Will dc cytotec d/t frequent ctx.

## 2019-03-09 NOTE — H&P (Addendum)
LABOR AND DELIVERY ADMISSION HISTORY AND PHYSICAL NOTE  Penny Duffy is a 38 y.o. female G2P1001 with IUP at [redacted]w[redacted]d by LMP presenting for IOL for GHTN.  She reports positive fetal movement. She denies leakage of fluid or vaginal bleeding.  Prenatal History/Complications: PNC at [facility] Pregnancy complications:  - Hyperthyroidism  - Gestational hypertension  - AMA   Past Medical History: Past Medical History:  Diagnosis Date  . Hyperthyroidism    Grave's Disease  . Medical history non-contributory   . Pregnancy induced hypertension     Past Surgical History: Past Surgical History:  Procedure Laterality Date  . NO PAST SURGERIES    . Wisdom Tooth Removal      Obstetrical History: OB History    Gravida  2   Para  1   Term  1   Preterm      AB      Living  1     SAB      TAB      Ectopic      Multiple  0   Live Births  1           Social History: Social History   Socioeconomic History  . Marital status: Single    Spouse name: Not on file  . Number of children: Not on file  . Years of education: Not on file  . Highest education level: Not on file  Occupational History  . Not on file  Social Needs  . Financial resource strain: Not hard at all  . Food insecurity:    Worry: Never true    Inability: Never true  . Transportation needs:    Medical: No    Non-medical: Not on file  Tobacco Use  . Smoking status: Former Smoker    Types: Cigarettes  . Smokeless tobacco: Never Used  Substance and Sexual Activity  . Alcohol use: Not Currently  . Drug use: No  . Sexual activity: Yes    Birth control/protection: None  Lifestyle  . Physical activity:    Days per week: Not on file    Minutes per session: Not on file  . Stress: Only a little  Relationships  . Social connections:    Talks on phone: Not on file    Gets together: Not on file    Attends religious service: Not on file    Active member of club or organization: Not on file    Attends  meetings of clubs or organizations: Not on file    Relationship status: Not on file  Other Topics Concern  . Not on file  Social History Narrative  . Not on file    Family History: Family History  Problem Relation Age of Onset  . Thyroid disease Mother   . Diabetes Maternal Grandmother   . Diabetes Maternal Grandfather     Allergies: No Known Allergies  Medications Prior to Admission  Medication Sig Dispense Refill Last Dose  . aspirin EC 81 MG tablet Take 1 tablet (81 mg total) by mouth daily. 90 tablet 2 03/08/2019 at Unknown time  . amoxicillin (AMOXIL) 875 MG tablet Take by mouth.   Not Taking  . Fexofenadine HCl (MUCINEX ALLERGY PO) Take by mouth.   Not Taking  . Prenatal Vit-Fe Fumarate-FA (MULTIVITAMIN-PRENATAL) 27-0.8 MG TABS tablet Take 1 tablet by mouth daily at 12 noon. 30 each 8 Taking  . propylthiouracil (PTU) 50 MG tablet Take 50 mg by mouth 3 (three) times daily.   Taking  .  Pseudoephedrine HCl (SUDAFED PO) Take by mouth.   Not Taking  . terconazole (TERAZOL 7) 0.4 % vaginal cream Place 1 applicator vaginally at bedtime. (Patient not taking: Reported on 02/08/2019) 45 g 0 Not Taking     Review of Systems  All systems reviewed and negative except as stated in HPI  Physical Exam Blood pressure (!) 133/94, pulse (!) 106, last menstrual period 06/07/2018, SpO2 99 %, unknown if currently breastfeeding. General appearance: alert, cooperative and no distress Lungs: clear to auscultation bilaterally Heart: regular rate and rhythm Abdomen: soft, non-tender; bowel sounds normal Extremities: No calf swelling or tenderness Presentation: cephalic confirmed by bedside US  Fetal monitoring: 130/ moderate/ +accels/ no decelerations  Uterine activity: occasional UC  Dilation: Fingertip Effacement (%): Thick Station: Ballotable Exam by:: Lanice ShirtsV Luis Nickles CNM   Prenatal labs: ABO, Rh: --/--/O POS, O POS (04/15 1024) Antibody: NEG (04/15 1024) Rubella: 2.13 (09/27 1054) RPR: Non  Reactive (02/06 0913)  HBsAg: Negative (09/27 1054)  HIV: Non Reactive (02/06 0913)  GC/Chlamydia: pending GBS:   pending  2 hr Glucola: 83-118-113 Genetic screening:  normal Anatomy US: normal female  Nursing Staff Provider  Office Location   [facility] Dating  LMP  Language   English  Anatomy US   incomplete f/u scheduled  Flu Vaccine  Declined  Genetic Screen  NIPS: low risk boy  AFP: normal   TDaP vaccine   considering Hgb A1C or  GTT  Third trimester   Rhogam  o positive    LAB RESULTS   Feeding Plan Breast Blood Type O/Positive/-- (09/27 1054)   Contraception POP   Antibody Negative (09/27 1054)  Circumcision Yes  Rubella 2.13 (09/27 1054)  Pediatrician  Yes  RPR Non Reactive (09/27 1054)   Support Person Alex  HBsAg Negative (09/27 1054)   Prenatal Classes  HIV Non Reactive (09/27 1054)  BTL Consent n/a GBS  (For PCN allergy, check sensitivities)   VBAC Consent n/a Pap Negative 2018    Hgb Electro   Neg (done in last preg)    CF  Neg (done in last preg)    SMA     Waterbirth  [ ]  Class [ ]  Consent [ ]  CNM visit   Prenatal Transfer Tool  Maternal Diabetes: No Genetic Screening: Normal Maternal Ultrasounds/Referrals: Normal Fetal Ultrasounds or other Referrals:  None Maternal Substance Abuse:  No Significant Maternal Medications:  None Significant Maternal Lab Results: None  Results for orders placed or performed during the hospital encounter of 03/09/19 (from the past 24 hour(s))  Comprehensive metabolic panel   Collection Time: 03/09/19 10:08 AM  Result Value Ref Range   Sodium 134 (L) 135 - 145 mmol/L   Potassium 3.9 3.5 - 5.1 mmol/L   Chloride 105 98 - 111 mmol/L   CO2 20 (L) 22 - 32 mmol/L   Glucose, Bld 100 (H) 70 - 99 mg/dL   BUN 6 6 - 20 mg/dL   Creatinine, Ser 1.610.47 0.44 - 1.00 mg/dL   Calcium 8.7 (L) 8.9 - 10.3 mg/dL   Total Protein 5.8 (L) 6.5 - 8.1 g/dL   Albumin 2.2 (L) 3.5 - 5.0 g/dL   AST 16 15 - 41 U/L   ALT 10 0 - 44 U/L   Alkaline Phosphatase 155  (H) 38 - 126 U/L   Total Bilirubin 0.5 0.3 - 1.2 mg/dL   GFR calc non Af Amer >60 >60 mL/min   GFR calc Af Amer >60 >60 mL/min   Anion gap  9 5 - 15  Protein / creatinine ratio, urine   Collection Time: 03/09/19 10:08 AM  Result Value Ref Range   Creatinine, Urine 122.31 mg/dL   Total Protein, Urine 24 mg/dL   Protein Creatinine Ratio 0.20 (H) 0.00 - 0.15 mg/mg[Cre]  CBC   Collection Time: 03/09/19 10:09 AM  Result Value Ref Range   WBC 12.4 (H) 4.0 - 10.5 K/uL   RBC 4.16 3.87 - 5.11 MIL/uL   Hemoglobin 11.8 (L) 12.0 - 15.0 g/dL   HCT 96.0 (L) 45.4 - 09.8 %   MCV 85.8 80.0 - 100.0 fL   MCH 28.4 26.0 - 34.0 pg   MCHC 33.1 30.0 - 36.0 g/dL   RDW 11.9 14.7 - 82.9 %   Platelets 177 150 - 400 K/uL   nRBC 0.2 0.0 - 0.2 %  Type and screen   Collection Time: 03/09/19 10:24 AM  Result Value Ref Range   ABO/RH(D) O POS    Antibody Screen NEG    Sample Expiration      03/12/2019 Performed at [facility], [address], [place], [place] [address]   ABO/Rh   Collection Time: 03/09/19 10:24 AM  Result Value Ref Range   ABO/RH(D) O POS    No rh immune globuloin      NOT A RH IMMUNE GLOBULIN CANDIDATE, PT RH POSITIVE Performed at [facility], [address] [place], [place] [address]     Patient Active Problem List   Diagnosis Date Noted  . Gestational hypertension 03/09/2019  . Hyperthyroidism in pregnancy, antepartum 12/08/2018  . Advanced maternal age in multigravida, second trimester 09/16/2018  . Supervision of other normal pregnancy, antepartum 08/20/2018  . Hyperthyroidism 09/28/2017  . History of gestational hypertension 08/17/2017    Assessment: Kimberleigh Mehan is a 38 y.o. G2P1001 at [redacted]w[redacted]d here for IOL for GHTN  #Labor: IOL with cytotec and FB when dilated  #Pain: Pain medication ordered PRN  #FWB: Cat I  #ID:  GBS unknown, pending labs, will treat with penicillin  #MOF: breast #MOC: POP #Circ:  Yes, inpatient   Sharyon Cable,  CNM 03/09/2019, 12:43 PM

## 2019-03-09 NOTE — MAU Note (Signed)
Pt presents for induction for GHTN and hyperthyroidism, awaiting for bed in labor and delivery

## 2019-03-09 NOTE — MAU Note (Signed)
Bedside US performed by CNM at 1235 for presentation.  Vertex presentation

## 2019-03-10 ENCOUNTER — Encounter (HOSPITAL_COMMUNITY): Payer: Self-pay | Admitting: Family Medicine

## 2019-03-10 ENCOUNTER — Inpatient Hospital Stay (HOSPITAL_COMMUNITY): Payer: Managed Care, Other (non HMO) | Admitting: Anesthesiology

## 2019-03-10 DIAGNOSIS — Z3A38 38 weeks gestation of pregnancy: Secondary | ICD-10-CM

## 2019-03-10 DIAGNOSIS — O134 Gestational [pregnancy-induced] hypertension without significant proteinuria, complicating childbirth: Secondary | ICD-10-CM

## 2019-03-10 DIAGNOSIS — O99824 Streptococcus B carrier state complicating childbirth: Secondary | ICD-10-CM

## 2019-03-10 LAB — CBC
HCT: 35.8 % — ABNORMAL LOW (ref 36.0–46.0)
Hemoglobin: 11.8 g/dL — ABNORMAL LOW (ref 12.0–15.0)
MCH: 28.6 pg (ref 26.0–34.0)
MCHC: 33 g/dL (ref 30.0–36.0)
MCV: 86.7 fL (ref 80.0–100.0)
Platelets: 168 10*3/uL (ref 150–400)
RBC: 4.13 MIL/uL (ref 3.87–5.11)
RDW: 15.9 % — ABNORMAL HIGH (ref 11.5–15.5)
WBC: 14.7 10*3/uL — ABNORMAL HIGH (ref 4.0–10.5)
nRBC: 0 % (ref 0.0–0.2)

## 2019-03-10 LAB — RPR: RPR Ser Ql: NONREACTIVE

## 2019-03-10 LAB — T4: T4, Total: 11.9 ug/dL (ref 4.5–12.0)

## 2019-03-10 MED ORDER — COCONUT OIL OIL: 1.0000 "application " | TOPICAL_OIL | Status: DC | PRN

## 2019-03-10 MED ORDER — LIDOCAINE HCL (PF) 1 % IJ SOLN
INTRAMUSCULAR | Status: DC | PRN
  Administered 2019-03-10: 6 mL via EPIDURAL

## 2019-03-10 MED ORDER — BENZOCAINE-MENTHOL 20-0.5 % EX AERO
1.0000 "application " | INHALATION_SPRAY | CUTANEOUS | Status: DC | PRN
  Administered 2019-03-10: 1 via TOPICAL
  Filled 2019-03-10: qty 56

## 2019-03-10 MED ORDER — LACTATED RINGERS AMNIOINFUSION
INTRAVENOUS | Status: DC
  Administered 2019-03-10: 12:00:00 via INTRAUTERINE

## 2019-03-10 MED ORDER — OXYCODONE-ACETAMINOPHEN 5-325 MG PO TABS
1.0000 | ORAL_TABLET | ORAL | Status: DC | PRN
  Administered 2019-03-11: 1 via ORAL
  Filled 2019-03-10: qty 1

## 2019-03-10 MED ORDER — IBUPROFEN 600 MG PO TABS
600.0000 mg | ORAL_TABLET | Freq: Four times a day (QID) | ORAL | Status: DC
  Administered 2019-03-10 – 2019-03-12 (×7): 600 mg via ORAL
  Filled 2019-03-10 (×7): qty 1

## 2019-03-10 MED ORDER — FENTANYL-BUPIVACAINE-NACL 0.5-0.125-0.9 MG/250ML-% EP SOLN
12.0000 mL/h | EPIDURAL | Status: DC | PRN
  Filled 2019-03-10: qty 250

## 2019-03-10 MED ORDER — OXYCODONE-ACETAMINOPHEN 5-325 MG PO TABS: 2.0000 | ORAL_TABLET | ORAL | Status: DC | PRN

## 2019-03-10 MED ORDER — DIPHENHYDRAMINE HCL 25 MG PO CAPS: 25.0000 mg | ORAL_CAPSULE | Freq: Four times a day (QID) | ORAL | Status: DC | PRN

## 2019-03-10 MED ORDER — EPHEDRINE 5 MG/ML INJ: 10.0000 mg | INTRAVENOUS | Status: DC | PRN

## 2019-03-10 MED ORDER — SODIUM CHLORIDE (PF) 0.9 % IJ SOLN
INTRAMUSCULAR | Status: DC | PRN
  Administered 2019-03-10: 12 mL/h via EPIDURAL
  Administered 2019-03-10: 14 mL/h via EPIDURAL

## 2019-03-10 MED ORDER — PRENATAL MULTIVITAMIN CH
1.0000 | ORAL_TABLET | Freq: Every day | ORAL | Status: DC
  Administered 2019-03-11: 12:00:00 1 via ORAL
  Filled 2019-03-10: qty 1

## 2019-03-10 MED ORDER — OXYTOCIN 40 UNITS IN NORMAL SALINE INFUSION - SIMPLE MED
1.0000 m[IU]/min | INTRAVENOUS | Status: DC
  Administered 2019-03-10: 2 m[IU]/min via INTRAVENOUS
  Filled 2019-03-10: qty 1000

## 2019-03-10 MED ORDER — DIBUCAINE (PERIANAL) 1 % EX OINT: 1.0000 "application " | TOPICAL_OINTMENT | CUTANEOUS | Status: DC | PRN

## 2019-03-10 MED ORDER — PHENYLEPHRINE 40 MCG/ML (10ML) SYRINGE FOR IV PUSH (FOR BLOOD PRESSURE SUPPORT)
80.0000 ug | PREFILLED_SYRINGE | INTRAVENOUS | Status: DC | PRN
  Filled 2019-03-10: qty 10

## 2019-03-10 MED ORDER — ONDANSETRON HCL 4 MG PO TABS: 4.0000 mg | ORAL_TABLET | ORAL | Status: DC | PRN

## 2019-03-10 MED ORDER — FERROUS SULFATE 325 (65 FE) MG PO TABS
325.0000 mg | ORAL_TABLET | Freq: Two times a day (BID) | ORAL | Status: DC
  Administered 2019-03-11 (×2): 325 mg via ORAL
  Filled 2019-03-10 (×2): qty 1

## 2019-03-10 MED ORDER — TETANUS-DIPHTH-ACELL PERTUSSIS 5-2.5-18.5 LF-MCG/0.5 IM SUSP: 0.5000 mL | Freq: Once | INTRAMUSCULAR | Status: DC

## 2019-03-10 MED ORDER — WITCH HAZEL-GLYCERIN EX PADS
1.0000 "application " | MEDICATED_PAD | CUTANEOUS | Status: DC | PRN
  Administered 2019-03-10: 1 via TOPICAL

## 2019-03-10 MED ORDER — MEASLES, MUMPS & RUBELLA VAC IJ SOLR: 0.5000 mL | Freq: Once | INTRAMUSCULAR | Status: DC

## 2019-03-10 MED ORDER — MAGNESIUM HYDROXIDE 400 MG/5ML PO SUSP: 30.0000 mL | ORAL | Status: DC | PRN

## 2019-03-10 MED ORDER — SIMETHICONE 80 MG PO CHEW: 80.0000 mg | CHEWABLE_TABLET | ORAL | Status: DC | PRN

## 2019-03-10 MED ORDER — FENTANYL CITRATE (PF) 100 MCG/2ML IJ SOLN
100.0000 ug | INTRAMUSCULAR | Status: DC | PRN
  Administered 2019-03-10 (×2): 100 ug via INTRAVENOUS
  Filled 2019-03-10 (×2): qty 2

## 2019-03-10 MED ORDER — LACTATED RINGERS IV SOLN
500.0000 mL | Freq: Once | INTRAVENOUS | Status: AC
  Administered 2019-03-10: 500 mL via INTRAVENOUS

## 2019-03-10 MED ORDER — ACETAMINOPHEN 325 MG PO TABS: 650.0000 mg | ORAL_TABLET | ORAL | Status: DC | PRN

## 2019-03-10 MED ORDER — PHENYLEPHRINE 40 MCG/ML (10ML) SYRINGE FOR IV PUSH (FOR BLOOD PRESSURE SUPPORT)
80.0000 ug | PREFILLED_SYRINGE | INTRAVENOUS | Status: AC | PRN
  Administered 2019-03-10 (×3): 80 ug via INTRAVENOUS

## 2019-03-10 MED ORDER — ONDANSETRON HCL 4 MG/2ML IJ SOLN: 4.0000 mg | INTRAMUSCULAR | Status: DC | PRN

## 2019-03-10 MED ORDER — ZOLPIDEM TARTRATE 5 MG PO TABS: 5.0000 mg | ORAL_TABLET | Freq: Every evening | ORAL | Status: DC | PRN

## 2019-03-10 MED ORDER — PROPYLTHIOURACIL 50 MG PO TABS
50.0000 mg | ORAL_TABLET | Freq: Two times a day (BID) | ORAL | Status: DC
  Administered 2019-03-10 – 2019-03-11 (×3): 50 mg via ORAL
  Filled 2019-03-10 (×5): qty 1

## 2019-03-10 MED ORDER — EPHEDRINE 5 MG/ML INJ
10.0000 mg | INTRAVENOUS | Status: DC | PRN
  Administered 2019-03-10: 10 mg via INTRAVENOUS
  Filled 2019-03-10: qty 10

## 2019-03-10 MED ORDER — DIPHENHYDRAMINE HCL 50 MG/ML IJ SOLN: 12.5000 mg | INTRAMUSCULAR | Status: DC | PRN

## 2019-03-10 NOTE — Anesthesia Preprocedure Evaluation (Signed)
Anesthesia Evaluation  Patient identified by MRN, date of birth, ID band Patient awake    Reviewed: Allergy & Precautions, H&P , NPO status , Patient's Chart, lab work & pertinent test results, reviewed documented beta blocker date and time   Airway Mallampati: III  TM Distance: >3 FB Neck ROM: full    Dental no notable dental hx. (+) Teeth Intact   Pulmonary neg pulmonary ROS, former smoker,    Pulmonary exam normal breath sounds clear to auscultation       Cardiovascular hypertension, negative cardio ROS Normal cardiovascular exam Rhythm:regular Rate:Normal     Neuro/Psych negative neurological ROS  negative psych ROS   GI/Hepatic negative GI ROS, Neg liver ROS,   Endo/Other  Hyperthyroidism Morbid obesity  Renal/GU negative Renal ROS  negative genitourinary   Musculoskeletal   Abdominal   Peds  Hematology negative hematology ROS (+)   Anesthesia Other Findings   Reproductive/Obstetrics (+) Pregnancy                             Anesthesia Physical Anesthesia Plan  ASA: III  Anesthesia Plan: Epidural   Post-op Pain Management:    Induction:   PONV Risk Score and Plan:   Airway Management Planned:   Additional Equipment:   Intra-op Plan:   Post-operative Plan:   Informed Consent: I have reviewed the patients History and Physical, chart, labs and discussed the procedure including the risks, benefits and alternatives for the proposed anesthesia with the patient or authorized representative who has indicated his/her understanding and acceptance.       Plan Discussed with:   Anesthesia Plan Comments:         Anesthesia Quick Evaluation

## 2019-03-10 NOTE — Anesthesia Procedure Notes (Signed)
Epidural Patient location during procedure: OB Start time: 03/10/2019 9:10 AM End time: 03/10/2019 9:15 AM  Staffing Anesthesiologist: Bethena Midget, MD  Preanesthetic Checklist Completed: patient identified, site marked, surgical consent, pre-op evaluation, timeout performed, IV checked, risks and benefits discussed and monitors and equipment checked  Epidural Patient position: sitting Prep: site prepped and draped and DuraPrep Patient monitoring: continuous pulse ox and blood pressure Approach: midline Location: L3-L4 Injection technique: LOR air  Needle:  Needle type: Tuohy  Needle gauge: 17 G Needle length: 9 cm and 9 Needle insertion depth: 7 cm Catheter type: closed end flexible Catheter size: 19 Gauge Catheter at skin depth: 12 cm Test dose: negative  Assessment Events: blood not aspirated, injection not painful, no injection resistance, negative IV test and no paresthesia

## 2019-03-10 NOTE — Discharge Summary (Addendum)
Postpartum Discharge Summary     Patient Name: Penny Duffy DOB: [birth year] MRN: [id number]  Date of admission: 03/09/2019 Delivering Provider: Jaynie Collins A   Date of discharge: 03/12/2019  Admitting diagnosis: INDUCTION Intrauterine pregnancy: [redacted]w[redacted]d     Secondary diagnosis:  Principal Problem:   Vaginal delivery Active Problems:   Hyperthyroidism   Advanced maternal age in multigravida, second trimester   Gestational hypertension   Encounter for induction of labor  Additional problems: None     Discharge diagnosis: Term Pregnancy Delivered and Gestational Hypertension                                                                                                Post partum procedures: None  Augmentation: Pitocin, Cytotec and Foley Balloon  Complications: None  Hospital course:  Induction of Labor With Vaginal Delivery   38 y.o. yo G2P1001 at [redacted]w[redacted]d was admitted to the hospital 03/09/2019 for induction of labor.  Indication for induction: Gestational hypertension.  Patient had an uncomplicated labor course as follows: Membrane Rupture Time/Date: 10:40 AM ,03/10/2019   Intrapartum Procedures: Episiotomy: None [1]                                         Lacerations:  2nd degree [3];Perineal [11]  Patient had delivery of a Viable infant.  Information for the patient's newborn:  Gurseerat, Stives [[id number]]  Delivery Method: Vaginal, Spontaneous(Filed from Delivery Summary)   03/10/2019  Details of delivery can be found in separate delivery note.  Patient had a routine postpartum course. Blood pressures were mildly elevated on postpartum day 2. She was started on Enalapril 5mg  prior to discharge. Patient is discharged home 03/12/19.  Magnesium Sulfate recieved: No BMZ received: No  Physical exam  Vitals:   03/11/19 0750 03/11/19 1345 03/11/19 2211 03/12/19 0529  BP: 128/82 121/83 134/82 135/89  Pulse: 74 68 77 78  Resp: 18 16 18 18   Temp: 98.1 F (36.7 C)  97.8 F (36.6 C) 98.2 F (36.8 C) 97.7 F (36.5 C)  TempSrc: Oral Oral Oral Oral  SpO2: 100%  98% 99%  Weight:      Height:       General: alert, cooperative and no distress Lochia: appropriate Uterine Fundus: firm Incision: N/A DVT Evaluation: No evidence of DVT seen on physical exam. Labs: Lab Results  Component Value Date   WBC 14.7 (H) 03/10/2019   HGB 11.8 (L) 03/10/2019   HCT 35.8 (L) 03/10/2019   MCV 86.7 03/10/2019   PLT 168 03/10/2019   CMP Latest Ref Rng & Units 03/09/2019  Glucose 70 - 99 mg/dL 195(V)  BUN 6 - 20 mg/dL 6  Creatinine 7.47 - 1.85 mg/dL 5.01  Sodium 586 - 825 mmol/L 134(L)  Potassium 3.5 - 5.1 mmol/L 3.9  Chloride 98 - 111 mmol/L 105  CO2 22 - 32 mmol/L 20(L)  Calcium 8.9 - 10.3 mg/dL 7.4(V)  Total Protein 6.5 - 8.1 g/dL 3.5(L)  Total Bilirubin 0.3 - 1.2 mg/dL  0.5  Alkaline Phos 38 - 126 U/L 155(H)  AST 15 - 41 U/L 16  ALT 0 - 44 U/L 10    Discharge instruction: per After Visit Summary and "Baby and Me Booklet".  After visit meds:  Allergies as of 03/12/2019   No Known Allergies     Medication List    STOP taking these medications   aspirin EC 81 MG tablet   terconazole 0.4 % vaginal cream Commonly known as:  TERAZOL 7     TAKE these medications   enalapril 5 MG tablet Commonly known as:  VASOTEC Take 1 tablet (5 mg total) by mouth daily.   ferrous sulfate 325 (65 FE) MG tablet Take 1 tablet (325 mg total) by mouth 2 (two) times daily with a meal.   ibuprofen 600 MG tablet Commonly known as:  ADVIL Take 1 tablet (600 mg total) by mouth every 6 (six) hours.   multivitamin-prenatal 27-0.8 MG Tabs tablet Take 1 tablet by mouth daily at 12 noon.   propylthiouracil 50 MG tablet Commonly known as:  PTU Take 50 mg by mouth 2 (two) times daily.       Diet: low salt diet  Activity: Advance as tolerated. Pelvic rest for 6 weeks.   Outpatient follow up:4 weeks Follow up Appt:No future appointments. Follow up  Visit:   Please schedule this patient for Postpartum visit in: 4 weeks with the following provider: Any provider (Virtual) For C/S patients schedule nurse incision check in weeks 2 weeks: NA High risk pregnancy complicated by: GHTN Delivery mode:  SVD Anticipated Birth Control:  POPs PP Procedures needed: BP check (Virtual) Schedule Integrated BH visit: no   Newborn Data: Live born female  Birth Weight:   APGAR: 8, 9  Newborn Delivery   Birth date/time:  03/10/2019 16:32:00 Delivery type:  Vaginal, Spontaneous     Baby Feeding: Breast Disposition:home with mother   03/12/2019 Gwenevere AbbotNimeka Shawnette Augello, MD

## 2019-03-10 NOTE — Progress Notes (Signed)
Patient ID: Penny Duffy, female   DOB: Aug 08, 1981, 38 y.o.   MRN: [id number] Penny Duffy is a 38 y.o. G2P1001 at [redacted]w[redacted]d.  Subjective: Not feeling contractions w/ epidural.   Objective: BP 90/72   Pulse (!) 101   Temp 98.4 F (36.9 C) (Oral)   Resp 18   Ht 5\' 1"  (1.549 m)   Wt 84.4 kg   LMP 06/07/2018   SpO2 98%   BMI 35.14 kg/m    FHT:  FHR: 130 bpm, variability: mod, 15x15 accelerations:  Repetitive variabile  decelerations UC:   Q 3-4 minutes, MVU's 120-140 Dilation: 10 Dilation Complete Date: 03/10/19 Dilation Complete Time: 1220 Effacement (%): 100 Cervical Position: Posterior Station: 0, Plus 1 Presentation: Vertex Exam by:: [place], rn  CNM able to feel space around fetal head. Small amount of caput.   Labs: Results for orders placed or performed during the hospital encounter of 03/09/19 (from the past 24 hour(s))  CBC     Status: Abnormal   Collection Time: 03/10/19  7:10 AM  Result Value Ref Range   WBC 14.7 (H) 4.0 - 10.5 K/uL   RBC 4.13 3.87 - 5.11 MIL/uL   Hemoglobin 11.8 (L) 12.0 - 15.0 g/dL   HCT 94.0 (L) 76.8 - 08.8 %   MCV 86.7 80.0 - 100.0 fL   MCH 28.6 26.0 - 34.0 pg   MCHC 33.0 30.0 - 36.0 g/dL   RDW 11.0 (H) 31.5 - 94.5 %   Platelets 168 150 - 400 K/uL   nRBC 0.0 0.0 - 0.2 %    Assessment / Plan: [redacted]w[redacted]d week IUP Labor: Pushing x 1 hour w/ small amount of descent. Unable to feel any urge to push.  Fetal Wellbeing:  Category II. But mod variability, spontaneous accels and pos scalp stim. Overall reassuring fetal well-being.  Pain Control:  Epidural--Turned down to allow pt to feel urge to push Anticipated MOD:  Uncertain. Lack of descent possibly due to pt not able to feel to push. Consulted with Dr. Alysia Penna. Will Labor down and cut down epidural. Explained to pt that she may need C/S if she baby does not descend or if FHR becomes more concerning.    Katrinka Blazing, [place], CNM 03/10/2019 3:26 PM

## 2019-03-10 NOTE — Progress Notes (Signed)
OB/GYN Faculty Practice: Labor Progress Note  Subjective: Has been able to get some sleep after pain medication, feels like balloon is close to falling out.   Objective: BP 115/69   Pulse 97   Temp 97.6 F (36.4 C) (Oral)   Resp 20   Ht 5\' 1"  (1.549 m)   Wt 84.4 kg   LMP 06/07/2018   SpO2 99%   BMI 35.14 kg/m  Gen: exophthalmos R eye  mildly uncomfortable appearing Dilation: 5 Effacement (%): 60 Cervical Position: Posterior Station: -2 Presentation: Vertex Exam by:: Marlis Edelson MD  Assessment and Plan: 38 y.o. G2P1001 [redacted]w[redacted]d here for IOL for gHTN, hyperthyroid.  Labor:  FB now out, will start pitocin.   -- pain control: desires epidural -- PPH Risk: low  Fetal Well-Being: EFW 3941g >90% at 38w0. Cephalic by sutures on prior checks.  -- Category I - continuous fetal monitoring  -- GBS negative   GHTN: UPC 0.20, labs wnl on admission. Asymptomatic at this time. BP moderate range. -- continue to monitor closely   Genna Casimir S. Earlene Plater, DO OB/GYN Fellow, Faculty Practice  3:04 AM

## 2019-03-10 NOTE — Progress Notes (Signed)
OB/GYN Faculty Practice: Labor Progress Note  Subjective: Strip note.   Objective: BP (!) 90/51   Pulse 74   Temp 97.7 F (36.5 C) (Oral)   Resp 18   Ht 5\' 1"  (1.549 m)   Wt 84.4 kg   LMP 06/07/2018   SpO2 100%   BMI 35.14 kg/m  Gen: strip note Dilation: 5 Effacement (%): 60 Cervical Position: Posterior Station: -2 Presentation: Vertex Exam by:: Marlis Edelson MD  Assessment and Plan: 37 y.o. G2P1001 [redacted]w[redacted]d here for IOL for gHTN, hyperthyroid.  Labor: Continue to titrate pitocin per protocol.  -- pain control: desires epidural -- PPH Risk: low  Fetal Well-Being: EFW 3941g >90% at 38w0. Cephalic by sutures on prior checks. Last infant 7lbs 1oz.  -- Category I - continuous fetal monitoring  -- GBS negative   gHTN: UPC 0.20, labs wnl on admission. Asymptomatic at this time. BP moderate range. -- continue to monitor closely   Catcher Dehoyos S. Earlene Plater, DO OB/GYN Fellow, Faculty Practice

## 2019-03-10 NOTE — Progress Notes (Signed)
Patient ID: Penny Duffy, female   DOB: 1981-11-20, 38 y.o.   MRN: [id number] Penny Duffy is a 38 y.o. G2P1001 at [redacted]w[redacted]d.  Subjective: Not feeling contractions w/ epidural.  Objective: BP 115/71   Pulse 79   Temp 98.4 F (36.9 C) (Oral)   Resp 16   Ht 5\' 1"  (1.549 m)   Wt 84.4 kg   LMP 06/07/2018   SpO2 98%   BMI 35.14 kg/m    FHT:  FHR: 130 bpm, variability: mod,  accelerations:  15x15,  decelerations:  Repetitive variables since SROM 2 hours ago that have not resolved w/ position changes.  UC:   Q 3 minutes, mod-strong Dilation: 10 Dilation Complete Date: 03/10/19 Dilation Complete Time: 1220 Effacement (%): 100 Cervical Position: Posterior Station: 0 Presentation: Vertex Exam by:: Penny Duffy, cnm Trial of pushing-->pt not able to feel to push, minimal descent w/ pushing.   Labs: NA  Assessment / Plan: [redacted]w[redacted]d week IUP Labor: Transition->second stage Fetal Wellbeing:  Category I-II Pain Control:  Epidural  Anticipated MOD:  SVD EFW ~3900 Will labor down ~1 hour and then try to push.  Amnioinfusion started for variables.  MD aware for variables, EFW 8-11.   Penny Duffy, [place], [place] 03/10/2019 12:36 PM

## 2019-03-11 NOTE — Anesthesia Postprocedure Evaluation (Signed)
Anesthesia Post Note  Patient: Penny Duffy  Procedure(s) Performed: AN AD HOC LABOR EPIDURAL     Patient location during evaluation: Mother Baby Anesthesia Type: Epidural Level of consciousness: awake and alert Pain management: pain level controlled Vital Signs Assessment: post-procedure vital signs reviewed and stable Respiratory status: spontaneous breathing, nonlabored ventilation and respiratory function stable Cardiovascular status: stable Postop Assessment: no headache, no backache and epidural receding Anesthetic complications: no    Last Vitals:  Vitals:   03/11/19 0350 03/11/19 0750  BP: 120/84 128/82  Pulse: 71 74  Resp: 18 18  Temp: 36.5 C 36.7 C  SpO2:  100%    Last Pain:  Vitals:   03/11/19 0750  TempSrc: Oral  PainSc: 0-No pain                 Xiana Carns

## 2019-03-11 NOTE — Lactation Note (Signed)
This note was copied from a baby's chart. Lactation Consultation Note  Patient Name: Penny Duffy ASUOR'V Date: 03/11/2019 Reason for consult: Follow-up assessment;Mother's request Mom reports he has been feeding much better this evening and afternoon. Mom asked if I would come back in about an hour to watch him feed.  Agreed. Urged mom to call out for lactation if he started cuing earlier.  Maternal Data Has patient been taught Hand Expression?: Yes  Feeding Feeding Type: Breast Fed  LATCH Score Latch: Grasps breast easily, tongue down, lips flanged, rhythmical sucking.  Audible Swallowing: A few with stimulation  Type of Nipple: Everted at rest and after stimulation  Comfort (Breast/Nipple): Soft / non-tender  Hold (Positioning): Assistance needed to correctly position infant at breast and maintain latch.  LATCH Score: 8  Interventions Interventions: Assisted with latch;Hand express;Adjust position  Lactation Tools Discussed/Used Date initiated:: 03/11/19   Consult Status Consult Status: Follow-up Date: 03/12/19 Follow-up type: In-patient    [facility] Michaelle Copas 03/11/2019, 10:51 PM

## 2019-03-11 NOTE — Lactation Note (Signed)
This note was copied from a baby's chart. Lactation Consultation Note  Patient Name: Penny Duffy HRCBU'L Date: 03/11/2019 Reason for consult: Follow-up assessment;Mother's request Mom breastfeeding on arrival in football hold.  Infant sleepy and positioned poorly. Assist with repositioning him in cradle hold. Mom with wide spaced breasts.  Infant latched and breastfed with some rythmic sucking but still very sleepy.  He needed continued stimulation to keep him going.  Mom reports he started out strong in the football and was breastfeeding well. Heard some swallows.  Discussed with mom doing some pumping past breastfeeding with DEBP due to her hx of poor milk supply.Renae Gloss her to continue to massage and hand expression  Past breastfeedings to get some drops of colostrum to feed back to him. Urged to feed on cue and 8 or more times day.  Mom brought her DEBP to hospital.  It is a Web designer. Offered to assist mom with working it but she reports she knows how and she used it once today already. Praised bf efforts. Urged mom to call lactation as needed. Maternal Data Has patient been taught Hand Expression?: Yes  Feeding Feeding Type: Breast Fed  LATCH Score Latch: Grasps breast easily, tongue down, lips flanged, rhythmical sucking.  Audible Swallowing: A few with stimulation  Type of Nipple: Everted at rest and after stimulation  Comfort (Breast/Nipple): Soft / non-tender  Hold (Positioning): Assistance needed to correctly position infant at breast and maintain latch.  LATCH Score: 8  Interventions Interventions: Assisted with latch;Hand express;Adjust position  Lactation Tools Discussed/Used Date initiated:: 03/11/19   Consult Status Consult Status: Follow-up Date: 03/12/19 Follow-up type: In-patient    [facility] Michaelle Copas 03/11/2019, 10:53 PM

## 2019-03-11 NOTE — Lactation Note (Signed)
This note was copied from a baby's chart. Lactation Consultation Note  Patient Name: Penny Duffy ZSMOL'M Date: 03/11/2019 Reason for consult: Initial assessment;Early term 37-38.6wks P2, 8 hour female infant. Infant had one void diaper since delivery. Per mom, she attempted to breastfeed her daughter but had issues with latching. Mom has Sprectra 2 DEBP. Mom has Graves dx (hyperthyroidism) on PTU which is L2 medication and safe with breastfeeding.  Infant was reluctant to latch at this time will only take few suckles and stopped. Mom was  taught hand expression and mom taught back and infant was given 5 ml of colostrum by spoon. Mom knows to breastfeed according hunger cues, 8 or more times within 24 hours. LC discussed I & O. Mom made aware of O/P services, breastfeeding support groups, community resources, and our phone # for post-discharge questions.  Mom plans: 1. To breastfeed according hunger cues, 8 or more times within 24 hours. 2. Mom will continue to work towards latching infant to breast. 3. Mom will continue to hand express and give back volume (EBM) if infant is not latching to breast. 4. Mom will call Nurse or LC if she has any questions, concerns or need assistance with latching infant to breast.   Maternal Data Formula Feeding for Exclusion: No Has patient been taught Hand Expression?: Yes(5 ml ) Does the patient have breastfeeding experience prior to this delivery?: Yes  Feeding Feeding Type: Breast Fed  LATCH Score Latch: Too sleepy or reluctant, no latch achieved, no sucking elicited.  Audible Swallowing: None  Type of Nipple: Everted at rest and after stimulation  Comfort (Breast/Nipple): Soft / non-tender  Hold (Positioning): Assistance needed to correctly position infant at breast and maintain latch.  LATCH Score: 5  Interventions Interventions: Breast feeding basics reviewed;Assisted with latch;Skin to skin;Breast massage;Hand express;Support  pillows;Adjust position;Breast compression;Expressed milk;Position options  Lactation Tools Discussed/Used WIC Program: No   Consult Status Consult Status: Follow-up Date: 03/11/19 Follow-up type: In-patient    Danelle Earthly 03/11/2019, 12:58 AM

## 2019-03-11 NOTE — Progress Notes (Signed)
Post Partum Day 1 Subjective: no complaints, up ad lib and voiding  Objective: Blood pressure 121/83, pulse 68, temperature 97.8 F (36.6 C), temperature source Oral, resp. rate 16, height 5\' 1"  (1.549 m), weight 84.4 kg, last menstrual period 06/07/2018, SpO2 100 %, unknown if currently breastfeeding.  Physical Exam:  General: alert, cooperative, appears stated age and no distress Lochia: appropriate Uterine Fundus: firm DVT Evaluation: No evidence of DVT seen on physical exam.  Recent Labs    03/09/19 1009 03/10/19 0710  HGB 11.8* 11.8*  HCT 35.7* 35.8*    Assessment/Plan: Plan for discharge tomorrow, Breastfeeding, Lactation consult, Circumcision prior to discharge and Contraception POPs   LOS: 2 days   [place] 03/11/2019, 4:15 PM

## 2019-03-11 NOTE — Plan of Care (Signed)
  Problem: Activity: Goal: Will verbalize the importance of balancing activity with adequate rest periods Outcome: Progressing Note:  Discussed increase of bleeding flow during increased physical activity. Encouraged pt to notify nurse of any clots quarter to egg size or larger.

## 2019-03-12 DIAGNOSIS — Z3490 Encounter for supervision of normal pregnancy, unspecified, unspecified trimester: Secondary | ICD-10-CM

## 2019-03-12 LAB — THYROID STIMULATING IMMUNOGLOBULIN: Thyroid Stimulating Immunoglob: 0.57 IU/L — ABNORMAL HIGH (ref 0.00–0.55)

## 2019-03-12 MED ORDER — ENALAPRIL MALEATE 5 MG PO TABS: 5.0000 mg | ORAL_TABLET | Freq: Every day | ORAL | Status: DC

## 2019-03-12 MED ORDER — ENALAPRIL MALEATE 5 MG PO TABS: 5.0000 mg | ORAL_TABLET | Freq: Every day | ORAL | 3 refills | Status: DC

## 2019-03-12 MED ORDER — FERROUS SULFATE 325 (65 FE) MG PO TABS: 325.0000 mg | ORAL_TABLET | Freq: Two times a day (BID) | ORAL | 3 refills | Status: DC

## 2019-03-12 MED ORDER — IBUPROFEN 600 MG PO TABS: 600.0000 mg | ORAL_TABLET | Freq: Four times a day (QID) | ORAL | 0 refills | Status: DC

## 2019-03-12 NOTE — Lactation Note (Signed)
This note was copied from a baby's chart. Lactation Consultation Note  Patient Name: Penny Duffy TDHRC'B Date: 03/12/2019 Reason for consult: Follow-up assessment;Early term 37-38.6wks;Infant weight loss(7% weight loss/ 36 hours - Bili 7.7 / see LC note )  Baby is 40 hours old and for D/C today.  Per mom feels better about the latching and it seems to have gotten easier/ breast are fuller and heavier.  LC reassured mom that is a good sign for her milk coming to volume.  LC reviewed sore nipple and engorgement prevention and tx.  Per mom has DEBP for home - Spectra DEBP.  LC reviewed cluster feeding / especially with weight loss/ to feed 8-12 times day and not to go over 3 hours  Without feeding STS until the baby is back to birth weight and steadily gaining.  Resources discussed - virtual BFSG ( need to sign up) , and the breast feeding helpline , LC O/P appt. At [facility].      Maternal Data Has patient been taught Hand Expression?: Yes  Feeding Feeding Type: (baby last fed approx 2 hours ago per mom )  LATCH Score ( Latch Score by the [facility] )  Latch: Repeated attempts needed to sustain latch, nipple held in mouth throughout feeding, stimulation needed to elicit sucking reflex.  Audible Swallowing: A few with stimulation  Type of Nipple: Everted at rest and after stimulation  Comfort (Breast/Nipple): Soft / non-tender  Hold (Positioning): Assistance needed to correctly position infant at breast and maintain latch.  LATCH Score: 7  Interventions Interventions: Breast feeding basics reviewed  Lactation Tools Discussed/Used Pump Review: Milk Storage(reviewed by MAI )   Consult Status Consult Status: Complete Date: 03/12/19    Kathrin Greathouse 03/12/2019, 9:28 AM

## 2019-03-14 ENCOUNTER — Telehealth: Payer: Self-pay | Admitting: Family Medicine

## 2019-03-14 NOTE — Telephone Encounter (Signed)
Called the patient to inform of upcoming appointment. Left a detailed voicemail of the upcoming appointment.

## 2019-03-14 NOTE — Telephone Encounter (Signed)
Called the patient to inform of upcoming appointments. No answer, left a detailed voicemail.

## 2019-03-15 ENCOUNTER — Encounter: Payer: Self-pay | Admitting: Obstetrics and Gynecology

## 2019-03-17 ENCOUNTER — Telehealth: Payer: Self-pay | Admitting: *Deleted

## 2019-03-17 NOTE — Telephone Encounter (Signed)
Pt called stating that she needs a form filled out by her doctor and wants to know the best way to get the paperwork to [place] and filled out quickly and back to her.

## 2019-03-22 ENCOUNTER — Ambulatory Visit: Payer: Self-pay

## 2019-03-23 ENCOUNTER — Ambulatory Visit: Payer: Managed Care, Other (non HMO)

## 2019-03-23 ENCOUNTER — Other Ambulatory Visit: Payer: Self-pay

## 2019-03-23 VITALS — BP 152/100

## 2019-03-23 DIAGNOSIS — Z0130 Encounter for examination of blood pressure without abnormal findings: Secondary | ICD-10-CM

## 2019-03-23 NOTE — Progress Notes (Signed)
Pt here today for BP check.  BP taken manually in LA 152/120 and rpt BP 5 min later RA 152/100.  Pt denies any headaches, blurred vision, or dizziness.  Pt states that she is not taking any medication at this time for BP.  Per chart review pt should be taking Vasotec 5 mg daily.  Notified Vonzella Nipple, PA who recommended that pt needs to start taking Vasotec again and schedule a rpt BP.  Informed pt the provider's recommendation pt stated that she will be able to come in on Monday for rpt BP check.  Front office notified to schedule appt.

## 2019-03-28 ENCOUNTER — Ambulatory Visit: Payer: Managed Care, Other (non HMO)

## 2019-03-28 DIAGNOSIS — Z029 Encounter for administrative examinations, unspecified: Secondary | ICD-10-CM

## 2019-04-11 ENCOUNTER — Ambulatory Visit: Payer: Self-pay | Admitting: Advanced Practice Midwife

## 2019-04-13 ENCOUNTER — Other Ambulatory Visit: Payer: Self-pay

## 2019-04-13 ENCOUNTER — Ambulatory Visit: Payer: Managed Care, Other (non HMO)

## 2019-04-13 DIAGNOSIS — Z5329 Procedure and treatment not carried out because of patient's decision for other reasons: Secondary | ICD-10-CM

## 2019-04-13 DIAGNOSIS — Z91199 Patient's noncompliance with other medical treatment and regimen due to unspecified reason: Secondary | ICD-10-CM

## 2019-04-13 NOTE — Progress Notes (Signed)
RN was able to reach patient to sign in but patient did not have access to either WebEx or Mychart. Attempted to call patient for telephone visit and unable to connect. Will reschedule.  Rolm Bookbinder, CNM 04/13/19 10:54 AM

## 2019-04-14 ENCOUNTER — Telehealth: Payer: Self-pay | Admitting: Obstetrics and Gynecology

## 2019-04-14 NOTE — Telephone Encounter (Signed)
The patient stated she was trying to access the visit via [employer] webex. Informed of mychart and she stated she will try. Rescheduled the visit and also provided the number to my chart help desk. The patient felt comfortable in completing the visit. Rescheduled and sending an appointment reminder.

## 2019-04-14 NOTE — Telephone Encounter (Signed)
The patient called in due to issues with mychart. Gave the patient the number to mychart help desk.

## 2019-04-25 ENCOUNTER — Encounter: Payer: Self-pay | Admitting: Advanced Practice Midwife

## 2019-04-25 ENCOUNTER — Telehealth: Payer: Self-pay | Admitting: Advanced Practice Midwife

## 2019-04-25 ENCOUNTER — Telehealth: Payer: Managed Care, Other (non HMO) | Admitting: Advanced Practice Midwife

## 2019-04-25 ENCOUNTER — Other Ambulatory Visit: Payer: Self-pay

## 2019-04-25 NOTE — Progress Notes (Signed)
@  110pm LVM          Patient appeared on mychart and wouldn't turn on camera or microphone Called patient verified identity and patient hung up on me.

## 2019-04-25 NOTE — Progress Notes (Signed)
Unable to connect with patient for virtual visit. Attempted to call patient and connect via mychart.   Thressa Sheller DNP, CNM  04/25/19  3:01 PM

## 2019-04-25 NOTE — Telephone Encounter (Signed)
Attempted to call patient x2 to get her rescheduled for her postpartum visit. First time the patient answer, and then hung up after I asked to speak with Luisa Dago. Second attempt, no answer and I left a voicemail for her to give the office a call back to get her postpartum visit rescheduled. No show letter mailed.

## 2019-05-03 ENCOUNTER — Telehealth: Payer: Self-pay | Admitting: Obstetrics & Gynecology

## 2019-05-03 NOTE — Telephone Encounter (Signed)
Attempted to call patient about her appointment. Left a detailed VM.

## 2019-05-04 ENCOUNTER — Other Ambulatory Visit: Payer: Self-pay

## 2019-05-04 ENCOUNTER — Ambulatory Visit (INDEPENDENT_AMBULATORY_CARE_PROVIDER_SITE_OTHER): Payer: Managed Care, Other (non HMO) | Admitting: Obstetrics & Gynecology

## 2019-05-04 VITALS — BP 127/88 | HR 92 | Temp 98.3°F | Ht 61.0 in | Wt 168.7 lb

## 2019-05-04 DIAGNOSIS — I10 Essential (primary) hypertension: Secondary | ICD-10-CM | POA: Insufficient documentation

## 2019-05-04 DIAGNOSIS — Z1389 Encounter for screening for other disorder: Secondary | ICD-10-CM

## 2019-05-04 DIAGNOSIS — Z30011 Encounter for initial prescription of contraceptive pills: Secondary | ICD-10-CM

## 2019-05-04 MED ORDER — ENALAPRIL MALEATE 5 MG PO TABS: 5.0000 mg | ORAL_TABLET | Freq: Every day | ORAL | 3 refills | Status: DC

## 2019-05-04 MED ORDER — NORGESTIM-ETH ESTRAD TRIPHASIC 0.18/0.215/0.25 MG-25 MCG PO TABS: 1.0000 | ORAL_TABLET | Freq: Every day | ORAL | 11 refills | Status: DC

## 2019-05-04 NOTE — Patient Instructions (Signed)

## 2019-05-04 NOTE — Progress Notes (Signed)
Subjective:     Penny Duffy is a 38 y.o. female who presents for a postpartum visit. She is 7 weeks postpartum following a spontaneous vaginal delivery. I have fully reviewed the prenatal and intrapartum course. The delivery was at [redacted]w[redacted]d gestational weeks. Outcome: spontaneous vaginal delivery. Anesthesia: epidural. Postpartum course has been complicated by elevated blood pressures. Baby's course has been uncomplicated. Baby is feeding by bottle - Enfamil Neuropro. Bleeding no bleeding. Bowel function is normal. Bladder function is normal. Patient is not sexually active. Contraception method is OCP (estrogen/progesterone). Postpartum depression screening: negative.  The following portions of the patient's history were reviewed and updated as appropriate: allergies, current medications, past family history, past medical history, past social history, past surgical history and problem list.  Review of Systems Pertinent items are noted in HPI.   Objective:    LMP 06/07/2018   General:  alert, cooperative and no distress           Abdomen: soft, non-tender; bowel sounds normal; no masses,  no organomegaly   Vulva:  not evaluated  Vagina: not evaluated                    Assessment:     normal postpartum exam. Pap smear not done at today's visit.   Plan:    1. Contraception: OCP (estrogen/progesterone) 2. Continue Enalapril 3. Follow up as needed. Needs PCP to manage HTN and hyperthyroidism Woodroe Mode, MD 05/04/2019

## 2022-01-09 LAB — HEPATIC FUNCTION PANEL
ALT: 23 U/L (ref 7–35)
AST: 18 (ref 13–35)
Alkaline Phosphatase: 84 (ref 25–125)
Bilirubin, Direct: 0.2
Bilirubin, Total: 0.3

## 2022-01-09 LAB — CBC: RBC: 4.8 (ref 3.87–5.11)

## 2022-01-09 LAB — CBC AND DIFFERENTIAL
HCT: 41 (ref 36–46)
Hemoglobin: 13.7 (ref 12.0–16.0)
Platelets: 297 10*3/uL (ref 150–400)
WBC: 7.9

## 2022-01-09 LAB — TSH: TSH: 0.88 (ref 0.41–5.90)

## 2022-01-09 LAB — HEMOGLOBIN A1C: Hemoglobin A1C: 5.2

## 2022-01-09 LAB — VITAMIN D 25 HYDROXY (VIT D DEFICIENCY, FRACTURES): Vit D, 25-Hydroxy: 18

## 2022-01-09 LAB — COMPREHENSIVE METABOLIC PANEL: Albumin: 4.6 (ref 3.5–5.0)

## 2022-01-10 ENCOUNTER — Other Ambulatory Visit: Payer: Self-pay | Admitting: Endocrinology

## 2022-01-10 DIAGNOSIS — E0590 Thyrotoxicosis, unspecified without thyrotoxic crisis or storm: Secondary | ICD-10-CM

## 2022-01-15 ENCOUNTER — Ambulatory Visit
Admission: RE | Admit: 2022-01-15 | Discharge: 2022-01-15 | Disposition: A | Discharge status: Home / Self Care | Payer: Managed Care, Other (non HMO) | Source: Ambulatory Visit | Attending: Endocrinology | Admitting: Endocrinology

## 2022-01-15 DIAGNOSIS — E0590 Thyrotoxicosis, unspecified without thyrotoxic crisis or storm: Secondary | ICD-10-CM

## 2022-05-13 ENCOUNTER — Other Ambulatory Visit: Payer: Self-pay | Admitting: Endocrinology

## 2022-05-13 DIAGNOSIS — E041 Nontoxic single thyroid nodule: Secondary | ICD-10-CM

## 2022-06-12 LAB — HEPATIC FUNCTION PANEL
ALT: 32 U/L (ref 7–35)
AST: 22 (ref 13–35)
Alkaline Phosphatase: 88 (ref 25–125)
Bilirubin, Direct: 0.12
Bilirubin, Total: 0.5

## 2022-06-12 LAB — VITAMIN D 25 HYDROXY (VIT D DEFICIENCY, FRACTURES): Vit D, 25-Hydroxy: 28.8

## 2022-06-12 LAB — CBC AND DIFFERENTIAL
HCT: 43 (ref 36–46)
Hemoglobin: 13.7 (ref 12.0–16.0)
Neutrophils Absolute: 7.5
Platelets: 313 10*3/uL (ref 150–400)
WBC: 10

## 2022-06-12 LAB — HEMOGLOBIN A1C: Hemoglobin A1C: 5.3

## 2022-06-12 LAB — TSH: TSH: 1.48 (ref 0.41–5.90)

## 2022-06-12 LAB — COMPREHENSIVE METABOLIC PANEL: Albumin: 4.6 (ref 3.5–5.0)

## 2022-06-12 LAB — CBC: RBC: 4.99 (ref 3.87–5.11)

## 2022-07-15 ENCOUNTER — Ambulatory Visit
Admission: RE | Admit: 2022-07-15 | Discharge: 2022-07-15 | Disposition: A | Discharge status: Home / Self Care | Payer: Managed Care, Other (non HMO) | Source: Ambulatory Visit | Attending: Endocrinology | Admitting: Endocrinology

## 2022-07-15 DIAGNOSIS — E041 Nontoxic single thyroid nodule: Secondary | ICD-10-CM

## 2023-02-12 ENCOUNTER — Ambulatory Visit: Payer: Managed Care, Other (non HMO) | Admitting: Physician Assistant

## 2023-02-12 ENCOUNTER — Encounter: Payer: Self-pay | Admitting: Physician Assistant

## 2023-02-12 VITALS — BP 138/90 | HR 70 | Temp 97.5°F | Ht 62.5 in | Wt 185.2 lb

## 2023-02-12 DIAGNOSIS — F329 Major depressive disorder, single episode, unspecified: Secondary | ICD-10-CM

## 2023-02-12 DIAGNOSIS — Z124 Encounter for screening for malignant neoplasm of cervix: Secondary | ICD-10-CM

## 2023-02-12 DIAGNOSIS — Z8759 Personal history of other complications of pregnancy, childbirth and the puerperium: Secondary | ICD-10-CM | POA: Diagnosis not present

## 2023-02-12 DIAGNOSIS — E559 Vitamin D deficiency, unspecified: Secondary | ICD-10-CM

## 2023-02-12 DIAGNOSIS — R5383 Other fatigue: Secondary | ICD-10-CM | POA: Diagnosis not present

## 2023-02-12 DIAGNOSIS — E0590 Thyrotoxicosis, unspecified without thyrotoxic crisis or storm: Secondary | ICD-10-CM | POA: Diagnosis not present

## 2023-02-12 DIAGNOSIS — Z1159 Encounter for screening for other viral diseases: Secondary | ICD-10-CM

## 2023-02-12 LAB — COMPREHENSIVE METABOLIC PANEL
ALT: 16 U/L (ref 0–35)
AST: 13 U/L (ref 0–37)
Albumin: 4.5 g/dL (ref 3.5–5.2)
Alkaline Phosphatase: 76 U/L (ref 39–117)
BUN: 11 mg/dL (ref 6–23)
CO2: 29 mEq/L (ref 19–32)
Calcium: 9.5 mg/dL (ref 8.4–10.5)
Chloride: 102 mEq/L (ref 96–112)
Creatinine, Ser: 0.7 mg/dL (ref 0.40–1.20)
GFR: 106.74 mL/min (ref >60.00)
Glucose, Bld: 98 mg/dL (ref 70–99)
Potassium: 4.1 mEq/L (ref 3.5–5.1)
Sodium: 137 mEq/L (ref 135–145)
Total Bilirubin: 0.3 mg/dL (ref 0.2–1.2)
Total Protein: 7.2 g/dL (ref 6.0–8.3)

## 2023-02-12 LAB — HEMOGLOBIN A1C: Hgb A1c MFr Bld: 5.1 % (ref 4.6–6.5)

## 2023-02-12 LAB — LIPID PANEL
Cholesterol: 185 mg/dL (ref 0–200)
HDL: 54.2 mg/dL (ref >39.00)
LDL Cholesterol: 98 mg/dL (ref 0–99)
NonHDL: 131.18
Total CHOL/HDL Ratio: 3
Triglycerides: 164 mg/dL — ABNORMAL HIGH (ref 0.0–149.0)
VLDL: 32.8 mg/dL (ref 0.0–40.0)

## 2023-02-12 LAB — CBC WITH DIFFERENTIAL/PLATELET
Basophils Absolute: 0 10*3/uL (ref 0.0–0.1)
Basophils Relative: 0.4 % (ref 0.0–3.0)
Eosinophils Absolute: 0.1 10*3/uL (ref 0.0–0.7)
Eosinophils Relative: 1.1 % (ref 0.0–5.0)
HCT: 42.7 % (ref 36.0–46.0)
Hemoglobin: 14.5 g/dL (ref 12.0–15.0)
Lymphocytes Relative: 17.8 % (ref 12.0–46.0)
Lymphs Abs: 1.9 10*3/uL (ref 0.7–4.0)
MCHC: 34 g/dL (ref 30.0–36.0)
MCV: 86.1 fl (ref 78.0–100.0)
Monocytes Absolute: 0.5 10*3/uL (ref 0.1–1.0)
Monocytes Relative: 5.2 % (ref 3.0–12.0)
Neutro Abs: 7.9 10*3/uL — ABNORMAL HIGH (ref 1.4–7.7)
Neutrophils Relative %: 75.5 % (ref 43.0–77.0)
Platelets: 283 10*3/uL (ref 150.0–400.0)
RBC: 4.95 Mil/uL (ref 3.87–5.11)
RDW: 14 % (ref 11.5–15.5)
WBC: 10.5 10*3/uL (ref 4.0–10.5)

## 2023-02-12 LAB — IBC + FERRITIN
Ferritin: 36.7 ng/mL (ref 10.0–291.0)
Iron: 54 ug/dL (ref 42–145)
Saturation Ratios: 14.4 % — ABNORMAL LOW (ref 20.0–50.0)
TIBC: 373.8 ug/dL (ref 250.0–450.0)
Transferrin: 267 mg/dL (ref 212.0–360.0)

## 2023-02-12 LAB — VITAMIN B12: Vitamin B-12: 366 pg/mL (ref 211–911)

## 2023-02-12 LAB — VITAMIN D 25 HYDROXY (VIT D DEFICIENCY, FRACTURES): VITD: 16.67 ng/mL — ABNORMAL LOW (ref 30.00–100.00)

## 2023-02-12 NOTE — Patient Instructions (Addendum)
It was great to see you!  Buy a pill box Get a mammogram Schedule an appt with gynecology Follow-up with Dr Chalmers Cater -- start taking your medication from her correctly, I KNOW this will make a positive difference for you  Let's follow-up in 3 months, sooner if you have concerns.  Take care,  Inda Coke PA-C

## 2023-02-12 NOTE — Progress Notes (Signed)
Subjective:    Penny Duffy is a 42 y.o. female and is here for a comprehensive physical exam and to establish care.  HPI  Health Maintenance Due  Topic Date Due   Hepatitis C Screening  Never done    Acute Concerns: None.  Chronic Issues: Hyperthyroidism:  She reports symptoms began while pregnant with her first child, when she developed Grave's disease. She was previously following up with Endo at the time.  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  During her second pregnancy symptoms were worse than prior. She is prescribed to take PTU, 50 mg twice daily but is only taking 50 mg once daily.  She states that PTU tends to make her sweat.  She endorses bloating and abdominal tightness.   Reactive depression: She is taking two 20 mg of Prozac at the same time.  She is following up with her therapist, Dr. Toy Care. She endorses chronic fatigue, which is believes is not attributed to her general mood.  She was previously prescribed Lexapro after her first pregnancy.   Weight history: Wt Readings from Last 10 Encounters:  02/12/23 185 lb 4 oz (84 kg)  05/04/19 168 lb 11.2 oz (76.5 kg)  03/09/19 186 lb (84.4 kg)  02/08/19 187 lb 9.6 oz (85.1 kg)  02/04/19 184 lb 3.2 oz (83.6 kg)  01/20/19 185 lb 4.8 oz (84.1 kg)  12/30/18 183 lb 8 oz (83.2 kg)  12/28/18 181 lb 9.6 oz (82.4 kg)  12/08/18 179 lb 11.2 oz (81.5 kg)  11/16/18 179 lb 6.4 oz (81.4 kg)   Body mass index is 33.34 kg/m. Patient's last menstrual period was 01/16/2023 (approximate).  Alcohol use:  reports that she does not currently use alcohol.  Tobacco use:  Tobacco Use: Medium Risk (02/12/2023)   Patient History    Smoking Tobacco Use: Former    Smokeless Tobacco Use: Never    Passive Exposure: Not on file   Eligible for lung cancer screening? no     02/12/2023    8:27 AM  Depression screen PHQ 2/9  Decreased Interest 0  Down, Depressed, Hopeless 0  PHQ - 2 Score 0  Altered  sleeping 0  Tired, decreased energy 2  Change in appetite 2  Feeling bad or failure about yourself  0  Trouble concentrating 1  Moving slowly or fidgety/restless 0  Suicidal thoughts 0  PHQ-9 Score 5  Difficult doing work/chores Somewhat difficult     Other providers/specialists: Patient Care Team: Inda Coke, [place] as PCP - General (Physician Assistant)    PMHx, SurgHx, SocialHx, Medications, and Allergies were reviewed in the Visit Navigator and updated as appropriate.   Past Medical History:  Diagnosis Date   Graves disease    Hyperthyroidism    Grave's Disease   Medical history non-contributory    Pregnancy induced hypertension    Vaginal delivery    2018, 2020     Past Surgical History:  Procedure Laterality Date   EYE SURGERY Right 2020   Orbital decompression   NO PAST SURGERIES     Wisdom Tooth Removal       Family History  Problem Relation Age of Onset   Hypothyroidism Mother    Hypertension Father    Hypertension Brother    Diabetes Maternal Grandmother    Diabetes Maternal Grandfather    Diabetes Paternal Grandmother    Colon cancer Neg Hx    Breast cancer Neg Hx     Social History  Tobacco Use   Smoking status: Former    Types: Cigarettes   Smokeless tobacco: Never  Vaping Use   Vaping Use: Never used  Substance Use Topics   Alcohol use: Not Currently   Drug use: Not Currently    Types: Marijuana    Review of Systems:   Review of Systems  Constitutional:  Positive for diaphoresis and malaise/fatigue.  Psychiatric/Behavioral:  The patient is nervous/anxious.     Objective:   BP (!) 138/90 (BP Location: Left Arm, Patient Position: Sitting, Cuff Size: Large)   Pulse 70   Temp (!) 97.5 F (36.4 C) (Temporal)   Ht 5' 2.5" (1.588 m)   Wt 185 lb 4 oz (84 kg)   LMP 01/16/2023 (Approximate)   SpO2 98%   Breastfeeding No   BMI 33.34 kg/m  Body mass index is 33.34 kg/m.   General Appearance:    Alert, cooperative, no  distress, appears stated age  Head:    Normocephalic, without obvious abnormality, atraumatic  Eyes:    PERRL, conjunctiva/corneas clear, EOM's intact, fundi    benign, both eyes        Throat:   Lips, mucosa, and tongue normal; teeth and gums normal  Neck:   Supple, symmetrical, trachea midline, no adenopathy;    thyroid:  no enlargement/tenderness/nodules; no carotid   bruit or JVD  Back:     Symmetric, no curvature, ROM normal, no CVA tenderness  Lungs:     Clear to auscultation bilaterally, respirations unlabored  Chest Wall:    No tenderness or deformity   Heart:    Regular rate and rhythm, S1 and S2 normal, no murmur, rub or gallop           Extremities:   Extremities normal, atraumatic, no cyanosis or edema  Pulses:   2+ and symmetric all extremities  Skin:   Skin color, texture, turgor normal, no rashes or lesions  Lymph nodes:   Cervical, supraclavicular, and axillary nodes normal  Neurologic:   CNII-XII intact, normal strength, sensation and reflexes    throughout    Assessment/Plan:   History of gestational hypertension Reviewed history BP above goal -- no evidence of end-organ damage I discussed need to be compliant with thyroid medication as I feel like this will help her BP Follow-up in 3 months, sooner if concerns I have asked her to reach out to [place] if any readings are above goal when she goes to offices  She may consider getting BP monitor for home use  Hyperthyroidism Discussed need for compliance with thyroid medication  Vitamin D deficiency Update Vit D and provide recommendations  Fatigue, unspecified type Update blood work to r/o organic cause Recommended compliance with thyroid medication  Encounter for screening for other viral diseases Update Hep C and provide recommendations  Pap smear for cervical cancer screening Referral to gyn  Reactive depression Well controlled Continue prozac 40 mg -- I told her that I can take this over for her if  she likes  Follow-up in 3 months, sooner if concerns  I,Rachel Rivera,acting as a scribe for [employer], PA.,have documented all relevant documentation on the behalf of Inda Coke, PA,as directed by  Inda Coke, PA while in the presence of Inda Coke, [place].   I, Inda Coke, [place], have reviewed all documentation for this visit. The documentation on 02/12/23 for the exam, diagnosis, procedures, and orders are all accurate and complete.  I spent a total of 55 minutes on this visit, today 02/12/23,  which included reviewing previous notes from endocrinology, ordering tests, discussing plan of care with patient and using shared-decision making on next steps, refilling medications, and documenting the findings in the note.  Inda Coke, PA-C [place]

## 2023-02-13 ENCOUNTER — Other Ambulatory Visit: Payer: Self-pay | Admitting: Physician Assistant

## 2023-02-13 LAB — HEPATITIS C ANTIBODY: Hepatitis C Ab: NONREACTIVE

## 2023-02-13 MED ORDER — VITAMIN D (ERGOCALCIFEROL) 1.25 MG (50000 UNIT) PO CAPS: 50000.0000 [IU] | ORAL_CAPSULE | ORAL | 0 refills | Status: DC

## 2023-03-17 ENCOUNTER — Ambulatory Visit (INDEPENDENT_AMBULATORY_CARE_PROVIDER_SITE_OTHER): Payer: Managed Care, Other (non HMO)

## 2023-03-17 DIAGNOSIS — E538 Deficiency of other specified B group vitamins: Secondary | ICD-10-CM | POA: Diagnosis not present

## 2023-03-17 MED ORDER — CYANOCOBALAMIN 1000 MCG/ML IJ SOLN
1000.0000 ug | Freq: Once | INTRAMUSCULAR | Status: AC
  Administered 2023-03-17: 1000 ug via INTRAMUSCULAR

## 2023-03-17 NOTE — Progress Notes (Signed)
Pt here for B12 injection for Penny Worley, PA. Injection given in RIGHT deltoid. Pt tolerated well.   

## 2023-03-18 ENCOUNTER — Encounter: Payer: Managed Care, Other (non HMO) | Admitting: Radiology

## 2023-03-26 ENCOUNTER — Ambulatory Visit: Payer: Managed Care, Other (non HMO)

## 2023-03-26 ENCOUNTER — Other Ambulatory Visit: Payer: Self-pay | Admitting: Endocrinology

## 2023-03-26 DIAGNOSIS — E041 Nontoxic single thyroid nodule: Secondary | ICD-10-CM

## 2023-03-26 DIAGNOSIS — E538 Deficiency of other specified B group vitamins: Secondary | ICD-10-CM

## 2023-03-26 MED ORDER — CYANOCOBALAMIN 1000 MCG/ML IJ SOLN
1000.0000 ug | Freq: Once | INTRAMUSCULAR | Status: AC
  Administered 2023-03-26: 1000 ug via INTRAMUSCULAR

## 2023-03-26 NOTE — Progress Notes (Signed)
Penny Duffy 42 yr old female presents to office today for 2nd of 4 weekly B12 injections per Jarold Motto, [place]. Admministered CYANOCOBALAMIN 1,000 mg IM left arm. Patient tolerated well.

## 2023-03-30 ENCOUNTER — Encounter: Payer: Self-pay | Admitting: Physician Assistant

## 2023-04-06 ENCOUNTER — Ambulatory Visit: Payer: Managed Care, Other (non HMO) | Admitting: Physician Assistant

## 2023-04-08 ENCOUNTER — Ambulatory Visit (INDEPENDENT_AMBULATORY_CARE_PROVIDER_SITE_OTHER): Payer: Managed Care, Other (non HMO)

## 2023-04-08 DIAGNOSIS — E538 Deficiency of other specified B group vitamins: Secondary | ICD-10-CM | POA: Diagnosis not present

## 2023-04-08 MED ORDER — CYANOCOBALAMIN 1000 MCG/ML IJ SOLN
1000.0000 ug | Freq: Once | INTRAMUSCULAR | Status: AC
  Administered 2023-04-08: 1000 ug via INTRAMUSCULAR

## 2023-04-08 NOTE — Progress Notes (Signed)
Pt here for B12 injection for Samantha Worley, PA.  Injection given in right deltoid. Pt tolerated well.   

## 2023-04-24 ENCOUNTER — Other Ambulatory Visit: Payer: Managed Care, Other (non HMO)

## 2023-04-30 ENCOUNTER — Ambulatory Visit: Payer: Managed Care, Other (non HMO)

## 2023-05-06 ENCOUNTER — Ambulatory Visit (INDEPENDENT_AMBULATORY_CARE_PROVIDER_SITE_OTHER): Payer: Managed Care, Other (non HMO)

## 2023-05-06 ENCOUNTER — Telehealth: Payer: Self-pay | Admitting: Physician Assistant

## 2023-05-06 DIAGNOSIS — E538 Deficiency of other specified B group vitamins: Secondary | ICD-10-CM

## 2023-05-06 MED ORDER — CYANOCOBALAMIN 1000 MCG/ML IJ SOLN
1000.0000 ug | Freq: Once | INTRAMUSCULAR | Status: AC
  Administered 2023-05-06: 1000 ug via INTRAMUSCULAR

## 2023-05-06 NOTE — Telephone Encounter (Signed)
Pt has completed weekly series of b12 shots . She is scheduled for next b12 in a month and would like to know if she will need  labs before that appointment . Please advise . [phone]

## 2023-05-06 NOTE — Progress Notes (Signed)
Pt in office for nurse visit due to vitamin B-12 deficiency. Administered 1000 mcg/mL Cyanocobalamin injection to patient in right deltoid and patient tolerated well with no issues. Pt will check with front if she now needs to schedule next visit or wait for provider to advuise when checking labs again.

## 2023-05-06 NOTE — Telephone Encounter (Signed)
Contacted pt and informed her of this .

## 2023-05-06 NOTE — Telephone Encounter (Signed)
Penny Duffy, patient will not need labs until completed B12 protocol, she will need monthly injections x 3 months yet, then we can recheck labs one month after last injection.

## 2023-05-12 ENCOUNTER — Ambulatory Visit
Admission: RE | Admit: 2023-05-12 | Discharge: 2023-05-12 | Disposition: A | Discharge status: Home / Self Care | Payer: Managed Care, Other (non HMO) | Source: Ambulatory Visit | Attending: Endocrinology | Admitting: Endocrinology

## 2023-05-12 DIAGNOSIS — E041 Nontoxic single thyroid nodule: Secondary | ICD-10-CM

## 2023-05-14 ENCOUNTER — Other Ambulatory Visit (HOSPITAL_COMMUNITY)
Admission: RE | Admit: 2023-05-14 | Discharge: 2023-05-14 | Disposition: A | Discharge status: Home / Self Care | Payer: Managed Care, Other (non HMO) | Source: Ambulatory Visit | Attending: Radiology | Admitting: Radiology

## 2023-05-14 ENCOUNTER — Ambulatory Visit: Payer: Managed Care, Other (non HMO) | Admitting: Radiology

## 2023-05-14 ENCOUNTER — Encounter: Payer: Self-pay | Admitting: Radiology

## 2023-05-14 VITALS — BP 118/80 | Ht 62.0 in | Wt 185.0 lb

## 2023-05-14 DIAGNOSIS — Z01419 Encounter for gynecological examination (general) (routine) without abnormal findings: Secondary | ICD-10-CM | POA: Insufficient documentation

## 2023-05-14 NOTE — Progress Notes (Signed)
   Penny Duffy [birth year] [id number]   History:  42 y.o. G2P2 presents for annual exam.No gyn concerns.  Gynecologic History Patient's last menstrual period was 04/21/2023 (approximate). Period Cycle (Days): 28 Period Duration (Days): 5 Period Pattern: Regular Menstrual Flow: Heavy (heavy first few days) Menstrual Control: Maxi pad, Thin pad Dysmenorrhea: (!) Moderate Dysmenorrhea Symptoms: Cramping Contraception/Family planning: none Sexually active: yes Last Pap: 2018. Results were: normal Last mammogram: never  Obstetric History OB History  Gravida Para Term Preterm AB Living  2 2 2     2   SAB IAB Ectopic Multiple Live Births        0 2    # Outcome Date GA Lbr Len/2nd Weight Sex Delivery Anes PTL Lv  2 Term 03/10/19 [redacted]w[redacted]d 04:50 / 04:12 8 lb 5.7 oz (3.79 kg) M Vag-Spont EPI  LIV  1 Term 12/02/16 [redacted]w[redacted]d / 03:21 7 lb 1.4 oz (3.215 kg) F Vag-Spont EPI  LIV     The following portions of the patient's history were reviewed and updated as appropriate: allergies, current medications, past family history, past medical history, past social history, past surgical history, and problem list.  Review of Systems Pertinent items noted in HPI and remainder of comprehensive ROS otherwise negative.   Past medical history, past surgical history, family history and social history were all reviewed and documented in the EPIC chart.   Exam:  Vitals:   05/14/23 1049  BP: 118/80  Weight: 185 lb (83.9 kg)  Height: 5\' 2"  (1.575 m)   Body mass index is 33.84 kg/m.  General appearance:  Normal Thyroid:  Symmetrical, normal in size, without palpable masses or nodularity. Respiratory  Auscultation:  Clear without wheezing or rhonchi Cardiovascular  Auscultation:  Regular rate, without rubs, murmurs or gallops  Edema/varicosities:  Not grossly evident Abdominal  Soft,nontender, without masses, guarding or rebound.  Liver/spleen:  No organomegaly noted  Hernia:  None appreciated   Skin  Inspection:  Grossly normal Breasts: Examined lying and sitting.   Right: Without masses, retractions, nipple discharge or axillary adenopathy.   Left: Without masses, retractions, nipple discharge or axillary adenopathy. Genitourinary   Inguinal/mons:  Normal without inguinal adenopathy  External genitalia:  Normal appearing vulva with no masses, tenderness, or lesions  BUS/Urethra/Skene's glands:  Normal without masses or exudate  Vagina:  Normal appearing with normal color and discharge, no lesions  Cervix:  Normal appearing without discharge or lesions  Uterus:  Normal in size, shape and contour.  Mobile, nontender  Adnexa/parametria:     Rt: Normal in size, without masses or tenderness.   Lt: Normal in size, without masses or tenderness.  Anus and perineum: Normal   Raynelle Fanning, CMA present for exam  Assessment/Plan:   1. Well woman exam with routine gynecological exam - Cytology - PAP( [place]) - Schedule mammogram - Labs with PCP     Discussed SBE, colonoscopy and DEXA screening as directed/appropriate. Recommend of exercise weekly, including weight bearing exercise. Encouraged the use of seatbelts and sunscreen. Return in 1 year for annual or as needed.   Arlie Solomons B WHNP-BC 11:07 AM 05/14/2023

## 2023-05-15 LAB — CYTOLOGY - PAP
Adequacy: ABSENT
Comment: NEGATIVE
Diagnosis: NEGATIVE
High risk HPV: NEGATIVE

## 2023-05-19 ENCOUNTER — Other Ambulatory Visit: Payer: Self-pay | Admitting: Physician Assistant

## 2023-06-04 ENCOUNTER — Ambulatory Visit: Payer: Managed Care, Other (non HMO)

## 2023-06-09 ENCOUNTER — Encounter: Payer: Self-pay | Admitting: Physician Assistant

## 2023-06-09 ENCOUNTER — Ambulatory Visit: Payer: Managed Care, Other (non HMO)

## 2023-06-09 ENCOUNTER — Ambulatory Visit: Payer: Managed Care, Other (non HMO) | Admitting: Physician Assistant

## 2023-06-09 VITALS — BP 130/90 | HR 85 | Temp 97.7°F | Ht 62.0 in | Wt 185.4 lb

## 2023-06-09 DIAGNOSIS — E0590 Thyrotoxicosis, unspecified without thyrotoxic crisis or storm: Secondary | ICD-10-CM

## 2023-06-09 DIAGNOSIS — Z6833 Body mass index (BMI) 33.0-33.9, adult: Secondary | ICD-10-CM

## 2023-06-09 DIAGNOSIS — E559 Vitamin D deficiency, unspecified: Secondary | ICD-10-CM

## 2023-06-09 DIAGNOSIS — E538 Deficiency of other specified B group vitamins: Secondary | ICD-10-CM | POA: Diagnosis not present

## 2023-06-09 DIAGNOSIS — E669 Obesity, unspecified: Secondary | ICD-10-CM

## 2023-06-09 LAB — HEMOGLOBIN A1C: Hgb A1c MFr Bld: 5 % (ref 4.6–6.5)

## 2023-06-09 LAB — VITAMIN B12: Vitamin B-12: 518 pg/mL (ref 211–911)

## 2023-06-09 LAB — VITAMIN D 25 HYDROXY (VIT D DEFICIENCY, FRACTURES): VITD: 28.55 ng/mL — ABNORMAL LOW (ref 30.00–100.00)

## 2023-06-09 MED ORDER — CYANOCOBALAMIN 1000 MCG/ML IJ SOLN
1000.0000 ug | Freq: Once | INTRAMUSCULAR | Status: AC
Start: 2023-06-09 — End: 2023-06-09
  Administered 2023-06-09: 1000 ug via INTRAMUSCULAR

## 2023-06-09 NOTE — Progress Notes (Signed)
Penny Duffy is a 42 y.o. female here for a follow up of a pre-existing problem.  History of Present Illness:   Chief Complaint  Patient presents with   Vit B12 deficiency    HPI  B12:  She continues taking vitamin B12 supplements regularly since her last visit and reports her symptoms improved.  She continues having mid day energy crashes but symptom(s) have generally improved. Lab Results  Component Value Date   VITAMINB12 366 02/12/2023   Obesity She wants to lose with to help her low energy levels.  She is not exercising or dieting regularly at this time.  She has tried [employer] and lost weight while taking it but stopped due to her insurance stopping coverage.  She is interested in taking medication to help manage her weight loss.  Wt Readings from Last 3 Encounters:  06/09/23 185 lb 6.1 oz (84.1 kg)  05/14/23 185 lb (83.9 kg)  02/12/23 185 lb 4 oz (84 kg)    Hyperthyroidism: She had a follow [place] of her thyroid and reports no new changes since her last follow up.  She is to repeat her scan in 1 year now as opposed to 6 months.  She is compliant with her PTU as prescribed by endocrinology.  Mammogram:  She is due for mammogram and is planning on scheduling an appointment.    Past Medical History:  Diagnosis Date   Graves disease    Hyperthyroidism    Grave's Disease   Medical history non-contributory    Pregnancy induced hypertension    Vaginal delivery    2018, 2020     Social History   Tobacco Use   Smoking status: Former    Types: Cigarettes   Smokeless tobacco: Never  Vaping Use   Vaping status: Never Used  Substance Use Topics   Alcohol use: Not Currently   Drug use: Not Currently    Types: Marijuana    Past Surgical History:  Procedure Laterality Date   EYE SURGERY Right 2020   Orbital decompression   NO PAST SURGERIES     Wisdom Tooth Removal      Family History  Problem Relation Age of Onset   Hypothyroidism Mother    Hypertension  Father    Hypertension Brother    Diabetes Maternal Grandmother    Diabetes Maternal Grandfather    Diabetes Paternal Grandmother    Colon cancer Neg Hx    Breast cancer Neg Hx     No Known Allergies  Current Medications:   Current Outpatient Medications:    propylthiouracil (PTU) 50 MG tablet, Take 50 mg by mouth daily in the afternoon., Disp: , Rfl:    Review of Systems:   Review of Systems  Constitutional:  Positive for malaise/fatigue (low energy levels).       (+)frequent thirst    Vitals:   Vitals:   06/09/23 1109 06/09/23 1138  BP: (!) 126/90 (!) 130/90  Pulse: 85   Temp: 97.7 F (36.5 C)   TempSrc: Temporal   SpO2: 96%   Weight: 185 lb 6.1 oz (84.1 kg)   Height: 5\' 2"  (1.575 m)      Body mass index is 33.91 kg/m.  Physical Exam:   Physical Exam Vitals and nursing note reviewed.  Constitutional:      General: She is not in acute distress.    Appearance: She is well-developed. She is not ill-appearing or toxic-appearing.  Cardiovascular:     Rate and Rhythm: Normal rate and regular  rhythm.     Pulses: Normal pulses.     Heart sounds: Normal heart sounds, S1 normal and S2 normal.  Pulmonary:     Effort: Pulmonary effort is normal.     Breath sounds: Normal breath sounds.  Skin:    General: Skin is warm and dry.  Neurological:     Mental Status: She is alert.     GCS: GCS eye subscore is 4. GCS verbal subscore is 5. GCS motor subscore is 6.  Psychiatric:        Speech: Speech normal.        Behavior: Behavior normal. Behavior is cooperative.     Assessment and Plan:   B12 deficiency Update B12 and provide recommendations however she is mid-way through her B-12 injection protocol (just started monthly shots)  Vitamin D deficiency Update Vitamin D and provide recommendations  Obesity, unspecified classification, unspecified obesity type, unspecified whether serious comorbidity present Limited options due to insurance coverage Encouraged  trying to get into a regular exercise pattern  Hyperthyroidism Improved Management per endo    I,Shehryar Baig,acting as a scribe for [employer], PA.,have documented all relevant documentation on the behalf of Jarold Motto, PA,as directed by  Jarold Motto, PA while in the presence of Jarold Motto, [place].  I, Jarold Motto, [place], have reviewed all documentation for this visit. The documentation on 06/09/23 for the exam, diagnosis, procedures, and orders are all accurate and complete.   Jarold Motto, PA-C

## 2023-06-09 NOTE — Patient Instructions (Signed)
It was great to see you!  I will be in touch with blood work results and plan!  Goal is to walk 20 minutes at least two days per week.  Move your body!!!! Motivation does not come from a specific location in your brain. It is not a fixed part of your personality. It's also not an essential tool that we use to make [place] move. It is most often a consequence of that movement. But what if you have no motivation to exercise?   Perhaps the key to making exercise a sustainable part of everyday life is to find a form of movement that you can begin even when your motivation is low. Research shows that doing even small amounts of exercise is better than nothing, and anything more than your usual amount of movement will help boost your willpower.  Find something that feels easy.  Something that brings you joy.  Something that feels like precious time out rather than another boring job that has to get done.  Add in friends, good music, and anything that helps you to look forward to it each day, rather than dread it.   Adding in some form of exercise, however moderate, will pay you back in feelings of motivation. You may have to use the strategy of acting opposite to the urges, because you won't feel like working out. But the impact this simple action will have on that 'can't be bothered' feeling for the rest of the day is unmatched. Make this one thing happen and you are setting yourself up to win.  ---Why Has Nobody Told Me This Before?: Expert Advice for Navigating Life's Challenges by Dr. Antonieta Pert   Take care,  Jarold Motto PA-C

## 2023-11-04 IMAGING — US US THYROID
1 series · 13 of 25 positions shown · non-contrast
Comparison: None.

CLINICAL DATA: Hyperthyroid.

EXAM:
THYROID ULTRASOUND
TECHNIQUE: Ultrasound examination of the thyroid gland and adjacent soft
tissues was performed.

[Series 1: us thyroid · 0.07mm/px · 13 of 36 slices shown]
[im 1/36]
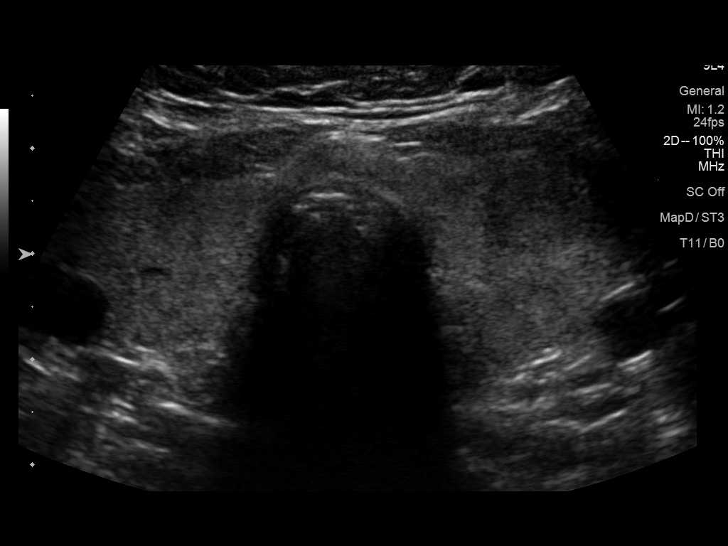
[im 3/36]
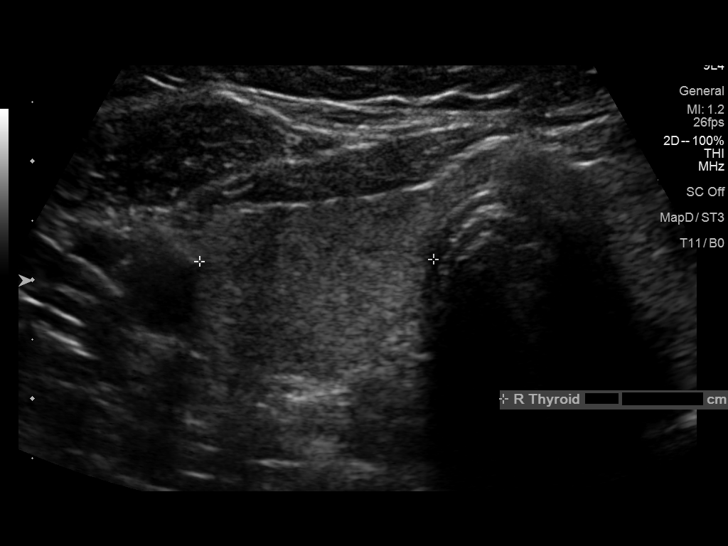
[im 6/36]
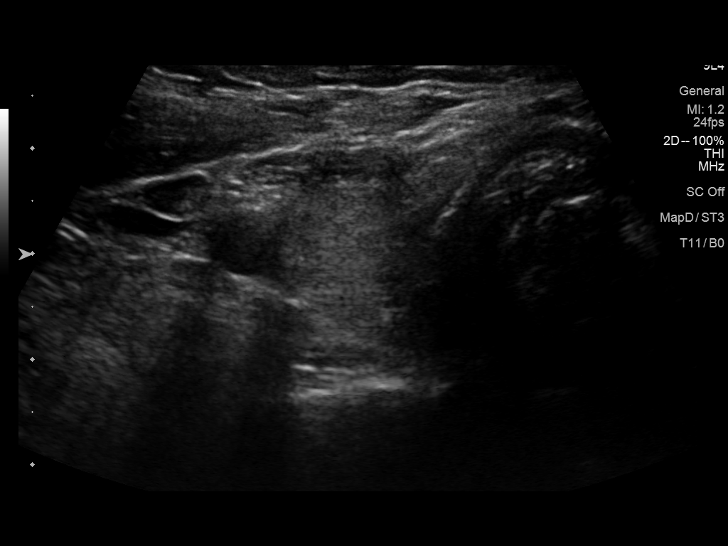
[im 9/36]
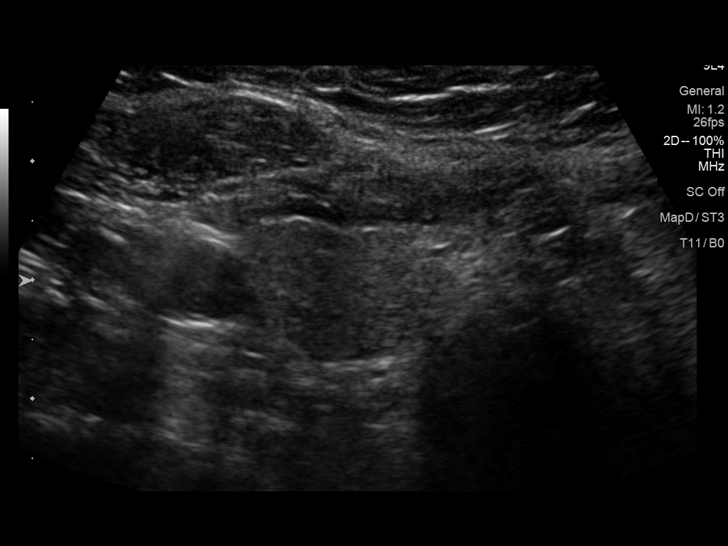
[im 12/36]
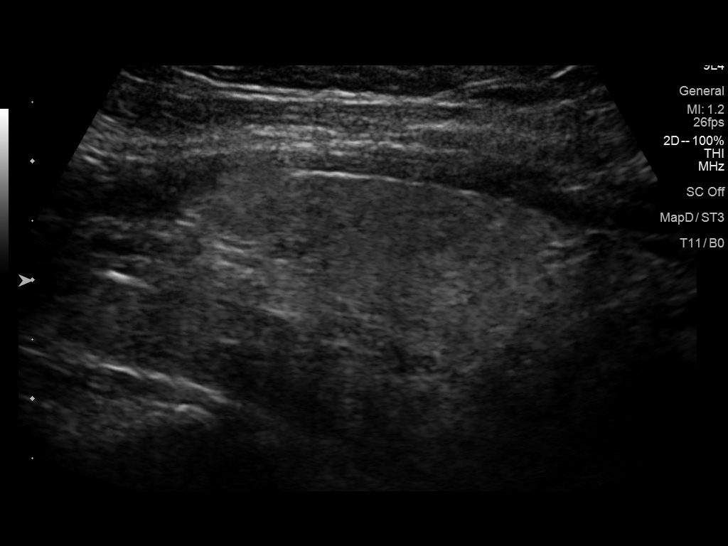
[im 15/36]
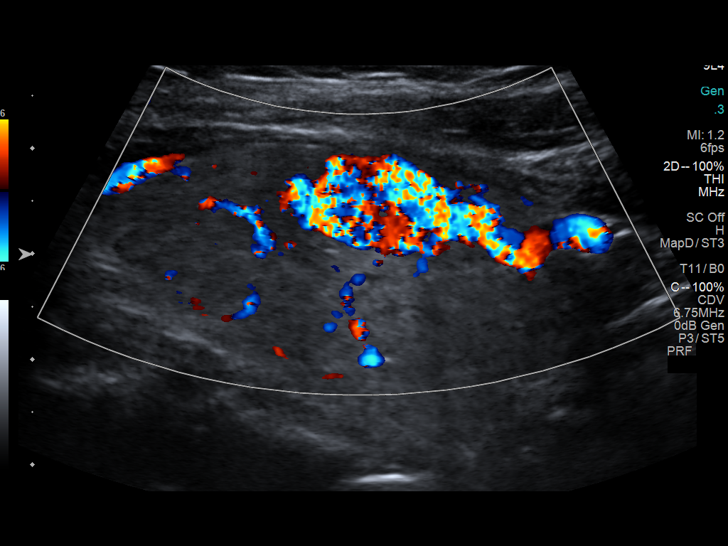
[im 18/36]
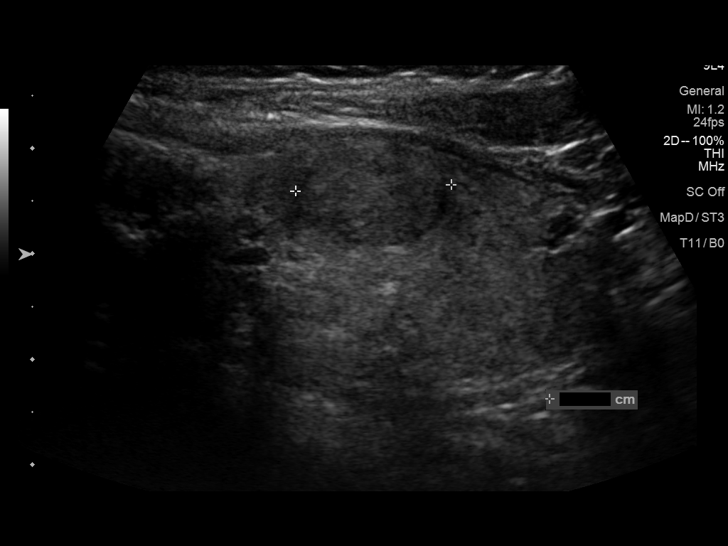
[im 21/36]
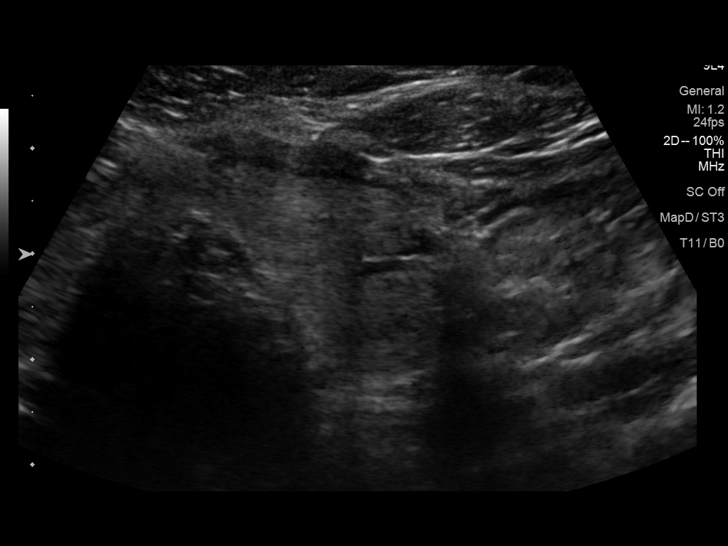
[im 24/36]
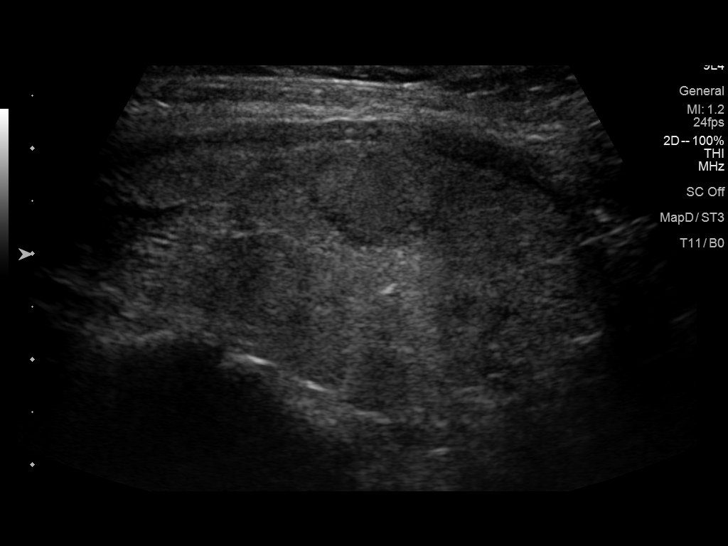
[im 27/36]
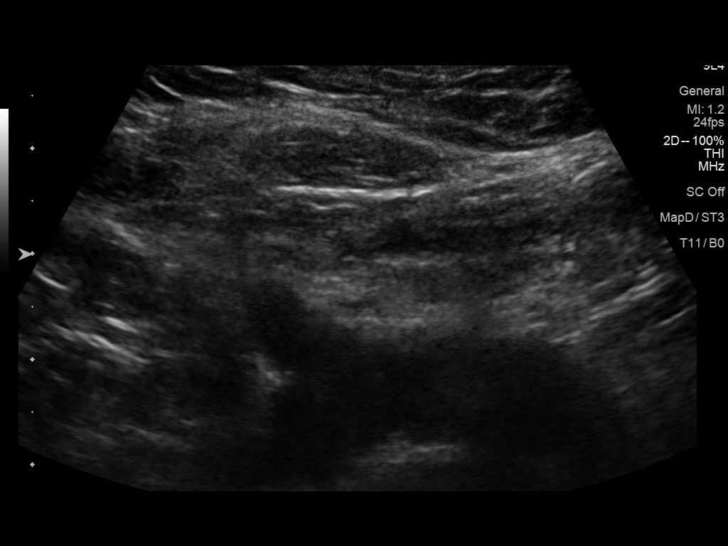
[im 30/36]
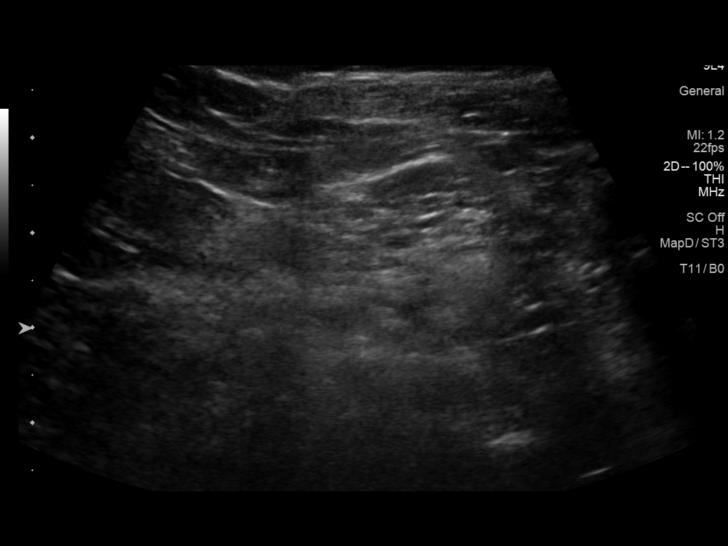
[im 33/36]
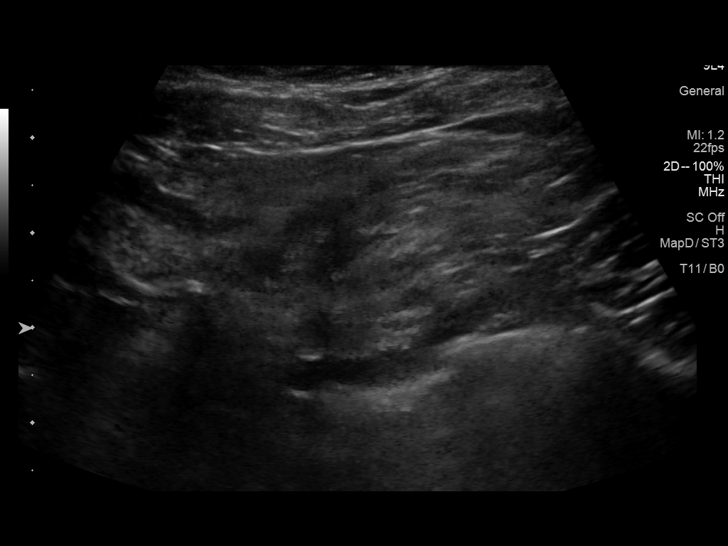
[im 36/36]
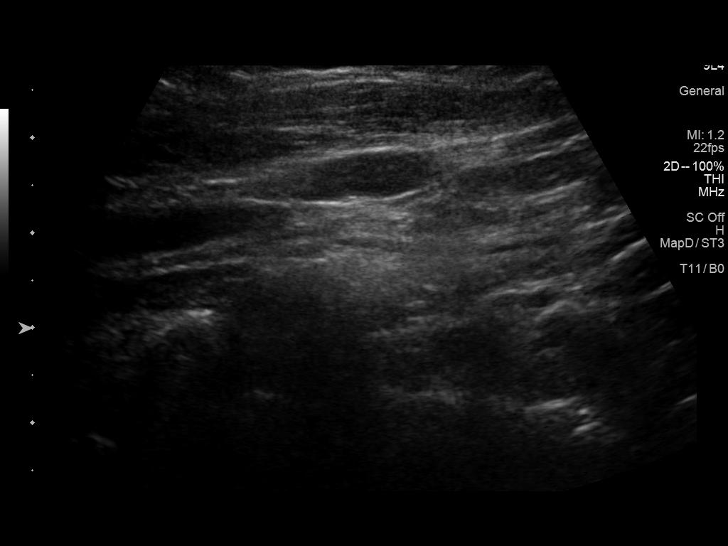

[13 of 25 positions shown; findings below may reference images not displayed]

FINDINGS: Parenchymal Echotexture: Mildly heterogenous

Isthmus: 0.7 cm

Right lobe: 5.5 x 1.8 x 2.0 cm

Left lobe: 5.8 x 3.0 x 2.2 cm

_________________________________________________________

Estimated total number of nodules >/= 1 cm: 1

Number of spongiform nodules >/=  2 cm not described below (TR1): 0

Number of mixed cystic and solid nodules >/= 1.5 cm not described
below (TR2): 0

_________________________________________________________

Nodule # 1:

Location: Left; Mid

Maximum size: 1.4 cm; Other 2 dimensions: 1.4 x 0.9 cm

Composition: solid/almost completely solid (2)

Echogenicity: hypoechoic (2)

Shape: not taller-than-wide (0)

Margins: ill-defined (0)

Echogenic foci: none (0)

ACR TI-RADS total points: 4.

ACR TI-RADS risk category: TR4 (4-6 points).

ACR TI-RADS recommendations:

*Given size (>/= 1 - 1.4 cm) and appearance, a follow-up ultrasound
in 1 year should be considered based on TI-RADS criteria.

_________________________________________________________

No significant hypervascularity in the background thyroid
parenchyma.
IMPRESSION: 1. Mildly heterogeneous and enlarged thyroid gland. No significant
hypervascularity to suggest Graves disease.
2. Solitary 1.4 cm TI-RADS category 4 nodule in the left mid gland.
This nodule meets criteria for imaging surveillance. Recommend
follow-up ultrasound in 1 year.

The above is in keeping with the ACR TI-RADS recommendations - [HOSPITAL] 2870;[DATE].

## 2023-11-13 ENCOUNTER — Ambulatory Visit: Payer: Managed Care, Other (non HMO) | Admitting: Obstetrics and Gynecology

## 2023-11-13 ENCOUNTER — Encounter: Payer: Self-pay | Admitting: Obstetrics and Gynecology

## 2023-11-13 VITALS — BP 124/82 | HR 100 | Wt 175.0 lb

## 2023-11-13 DIAGNOSIS — N9089 Other specified noninflammatory disorders of vulva and perineum: Secondary | ICD-10-CM | POA: Diagnosis not present

## 2023-11-13 DIAGNOSIS — Z202 Contact with and (suspected) exposure to infections with a predominantly sexual mode of transmission: Secondary | ICD-10-CM | POA: Diagnosis not present

## 2023-11-13 NOTE — Progress Notes (Signed)
42 y.o. G61P2002 female here for vaginal irritation.  Boyfriend x 10 years.  Patient's last menstrual period was 11/04/2023 (approximate). Period Duration (Days): 5-7 Period Pattern: Regular Menstrual Flow: Moderate Menstrual Control: Maxi pad Dysmenorrhea: (!) Mild  Patient reports that she was informed that her boyfriend of 10 years has been cheating recently.  And a partner of his was recently diagnosed with trichomonas.  Pt states she has some vaginal irritation and itching but her symptoms are not out of the ordinary.  She often has itching after shaving.  She denies any abnormal vaginal discharge irregular bleeding or pelvic pain.  She separately wanted to discuss removal of a vulvar skin tag.  GYN HISTORY: Significant history  OB History  Gravida Para Term Preterm AB Living  2 2 2   2   SAB IAB Ectopic Multiple Live Births     0 2    # Outcome Date GA Lbr Len/2nd Weight Sex Type Anes PTL Lv  2 Term 03/10/19 [redacted]w[redacted]d 04:50 / 04:12 8 lb 5.7 oz (3.79 kg) M Vag-Spont EPI  LIV  1 Term 12/02/16 [redacted]w[redacted]d / 03:21 7 lb 1.4 oz (3.215 kg) F Vag-Spont EPI  LIV    Past Medical History:  Diagnosis Date   Graves disease    Hyperthyroidism    Grave's Disease   Medical history non-contributory    Pregnancy induced hypertension    Vaginal delivery    2018, 2020    Past Surgical History:  Procedure Laterality Date   EYE SURGERY Right 2020   Orbital decompression   NO PAST SURGERIES     Wisdom Tooth Removal      Current Outpatient Medications on File Prior to Visit  Medication Sig Dispense Refill   propylthiouracil (PTU) 50 MG tablet Take 50 mg by mouth daily in the afternoon.     No current facility-administered medications on file prior to visit.    No Known Allergies    PE Today's Vitals   11/13/23 1101  BP: 124/82  Pulse: 100  SpO2: 99%  Weight: 175 lb (79.4 kg)   Body mass index is 32.01 kg/m.  Physical Exam Vitals reviewed. Exam conducted with a chaperone  present.  Constitutional:      General: She is not in acute distress.    Appearance: Normal appearance.  HENT:     Head: Normocephalic and atraumatic.     Nose: Nose normal.  Eyes:     Extraocular Movements: Extraocular movements intact.     Conjunctiva/sclera: Conjunctivae normal.  Pulmonary:     Effort: Pulmonary effort is normal.  Genitourinary:    General: Normal vulva.     Exam position: Lithotomy position.     Vagina: Normal. No vaginal discharge.     Cervix: Normal. No cervical motion tenderness, discharge or lesion.     Uterus: Normal. Not enlarged and not tender.      Adnexa: Right adnexa normal and left adnexa normal.       Comments: Skin tag of right vulva with narrow base. Musculoskeletal:        General: Normal range of motion.     Cervical back: Normal range of motion.  Neurological:     General: No focal deficit present.     Mental Status: She is alert.  Psychiatric:        Mood and Affect: Mood normal.        Behavior: Behavior normal.       Assessment and Plan:  Possible exposure to STD -     Hepatitis B surface antigen -     Hepatitis C antibody -     RPR -     HIV Antibody (routine testing w rflx) -     SureSwab Advanced Vaginitis Plus,TMA  Vulvar lesion  RTO for elective skin tag removal.  Rosalyn Gess, MD

## 2023-11-14 LAB — SURESWAB® ADVANCED VAGINITIS PLUS,TMA
C. trachomatis RNA, TMA: NOT DETECTED
CANDIDA SPECIES: NOT DETECTED
Candida glabrata: NOT DETECTED
N. gonorrhoeae RNA, TMA: NOT DETECTED
SURESWAB(R) ADV BACTERIAL VAGINOSIS(BV),TMA: NEGATIVE
TRICHOMONAS VAGINALIS (TV),TMA: NOT DETECTED

## 2023-11-19 ENCOUNTER — Telehealth: Payer: Self-pay

## 2023-11-19 NOTE — Telephone Encounter (Signed)
LVMTCB to determine if pt wanted to f/u with these labs

## 2023-11-19 NOTE — Progress Notes (Signed)
LVMTCB to determine if pt wants to do labs

## 2023-11-19 NOTE — Progress Notes (Signed)
FYI. Pt does not wish to have additional BW done at this time due to her vaginal swab being negative for infections and does not wish to have additional bills from the tests at this time. Routing to provider for final review.

## 2023-11-19 NOTE — Telephone Encounter (Signed)
-----   Message from Rosalyn Gess sent at 11/19/2023  8:37 AM EST ----- Please contact patient to determine if she wants to complete these labs. ----- Message ----- From: SYSTEM Sent: 11/18/2023  12:14 AM EST To: Rosalyn Gess, MD

## 2024-01-15 ENCOUNTER — Encounter: Payer: Self-pay | Admitting: Obstetrics and Gynecology

## 2024-01-15 ENCOUNTER — Other Ambulatory Visit (HOSPITAL_COMMUNITY)
Admission: RE | Admit: 2024-01-15 | Discharge: 2024-01-15 | Disposition: A | Discharge status: Home / Self Care | Payer: Managed Care, Other (non HMO) | Source: Ambulatory Visit | Attending: Obstetrics and Gynecology | Admitting: Obstetrics and Gynecology

## 2024-01-15 ENCOUNTER — Ambulatory Visit: Payer: Managed Care, Other (non HMO) | Admitting: Obstetrics and Gynecology

## 2024-01-15 VITALS — BP 138/76 | HR 92 | Temp 97.8°F | Wt 174.0 lb

## 2024-01-15 DIAGNOSIS — N9089 Other specified noninflammatory disorders of vulva and perineum: Secondary | ICD-10-CM

## 2024-01-15 MED ORDER — LIDOCAINE HCL (PF) 1 % IJ SOLN
3.0000 mL | Freq: Once | INTRAMUSCULAR | Status: AC
Start: 2024-01-15 — End: ?

## 2024-01-15 NOTE — Patient Instructions (Signed)
Postoperative instructions: Clean your incision daily with mild soapy water.  Sitz bath are helpful for wound healing.   Ensure that you thoroughly dry your wound following cleaning and keep dry throughout the day.  You can apply a thin layer of Aquaphor or Vaseline over your incision. Ice packs can be applied to your incision for up to 20 minutes at a time to help with pain and swelling. Take ibuprofen as prescribed and over-the-counter Tylenol as needed.

## 2024-01-15 NOTE — Progress Notes (Signed)
43 y.o. G34P2002 female here for right vulvar skin tag removal. Boyfriend x 10 years.  No complaints. Doing well.  Patient's last menstrual period was 01/07/2024 (approximate).    OB History  Gravida Para Term Preterm AB Living  2 2 2   2   SAB IAB Ectopic Multiple Live Births     0 2    # Outcome Date GA Lbr Len/2nd Weight Sex Type Anes PTL Lv  2 Term 03/10/19 [redacted]w[redacted]d 04:50 / 04:12 8 lb 5.7 oz (3.79 kg) M Vag-Spont EPI  LIV  1 Term 12/02/16 [redacted]w[redacted]d / 03:21 7 lb 1.4 oz (3.215 kg) F Vag-Spont EPI  LIV    Past Medical History:  Diagnosis Date   Graves disease    Hyperthyroidism    Grave's Disease   Medical history non-contributory    Pregnancy induced hypertension    Vaginal delivery    2018, 2020    Past Surgical History:  Procedure Laterality Date   EYE SURGERY Right 2020   Orbital decompression   NO PAST SURGERIES     Wisdom Tooth Removal      Current Outpatient Medications on File Prior to Visit  Medication Sig Dispense Refill   propylthiouracil (PTU) 50 MG tablet Take 50 mg by mouth daily in the afternoon.     No current facility-administered medications on file prior to visit.    No Known Allergies    PE Today's Vitals   01/15/24 1029  BP: 138/76  Pulse: 92  Temp: 97.8 F (36.6 C)  TempSrc: Oral  SpO2: 98%  Weight: 174 lb (78.9 kg)   Body mass index is 31.83 kg/m.  Physical Exam Vitals reviewed. Exam conducted with a chaperone present.  Constitutional:      General: She is not in acute distress.    Appearance: Normal appearance.  HENT:     Head: Normocephalic and atraumatic.     Nose: Nose normal.  Eyes:     Extraocular Movements: Extraocular movements intact.     Conjunctiva/sclera: Conjunctivae normal.  Pulmonary:     Effort: Pulmonary effort is normal.  Genitourinary:    General: Normal vulva.     Exam position: Lithotomy position.     Vagina: Normal. No vaginal discharge.     Cervix: Normal. No cervical motion tenderness, discharge or  lesion.     Uterus: Normal. Not enlarged and not tender.      Adnexa: Right adnexa normal and left adnexa normal.       Comments: Skin tag of right vulva with narrow base. Musculoskeletal:        General: Normal range of motion.     Cervical back: Normal range of motion.  Neurological:     General: No focal deficit present.     Mental Status: She is alert.  Psychiatric:        Mood and Affect: Mood normal.        Behavior: Behavior normal.     Procedure: Skin tag removal Informed consent:  Discussed risks (permanent scarring, infection, pain, bleeding, bruising, redness, and recurrence of the lesion) and benefits of the procedure, as well as the alternatives.  She is aware that skin tags are benign lesions, and their removal is often not considered medically necessary.  Informed consent was obtained. Time out was performed. Anesthesia: 1% lidocaine, 3cc The area was prepared and draped in a standard fashion. Tag was excised with scalpel.   Interrupted stitches using 4-0 vicryl were placed. The patient tolerated procedure  well. The patient was instructed on post-op care.   Number of lesions removed:  1    Assessment and Plan:        Vulvar lesion   S/p excision of vulvar skin tag. Uncomplicated procedure. RTO in 1 week.  Rosalyn Gess, MD

## 2024-01-19 ENCOUNTER — Encounter: Payer: Self-pay | Admitting: Obstetrics and Gynecology

## 2024-01-19 LAB — SURGICAL PATHOLOGY

## 2024-01-22 ENCOUNTER — Ambulatory Visit: Payer: Managed Care, Other (non HMO) | Admitting: Obstetrics and Gynecology

## 2024-04-20 ENCOUNTER — Ambulatory Visit: Admitting: Physician Assistant

## 2024-06-24 ENCOUNTER — Ambulatory Visit: Admitting: Physician Assistant

## 2024-06-24 VITALS — BP 122/72 | HR 89 | Temp 98.1°F | Ht 62.0 in | Wt 180.2 lb

## 2024-06-24 DIAGNOSIS — F902 Attention-deficit hyperactivity disorder, combined type: Secondary | ICD-10-CM

## 2024-06-24 DIAGNOSIS — E538 Deficiency of other specified B group vitamins: Secondary | ICD-10-CM | POA: Diagnosis not present

## 2024-06-24 DIAGNOSIS — E669 Obesity, unspecified: Secondary | ICD-10-CM

## 2024-06-24 DIAGNOSIS — E0590 Thyrotoxicosis, unspecified without thyrotoxic crisis or storm: Secondary | ICD-10-CM | POA: Diagnosis not present

## 2024-06-24 DIAGNOSIS — E559 Vitamin D deficiency, unspecified: Secondary | ICD-10-CM

## 2024-06-24 DIAGNOSIS — R5383 Other fatigue: Secondary | ICD-10-CM | POA: Diagnosis not present

## 2024-06-24 DIAGNOSIS — Z8759 Personal history of other complications of pregnancy, childbirth and the puerperium: Secondary | ICD-10-CM

## 2024-06-24 DIAGNOSIS — R29818 Other symptoms and signs involving the nervous system: Secondary | ICD-10-CM

## 2024-06-24 MED ORDER — LISDEXAMFETAMINE DIMESYLATE 10 MG PO CAPS: 10.0000 mg | ORAL_CAPSULE | Freq: Every day | ORAL | 0 refills | Status: DC

## 2024-06-24 MED ORDER — ZEPBOUND 2.5 MG/0.5ML ~~LOC~~ SOAJ: 2.5000 mg | SUBCUTANEOUS | 0 refills | Status: DC

## 2024-06-24 NOTE — Progress Notes (Signed)
 Penny Duffy is a 43 y.o. female here for a follow up of a pre-existing problem.  History of Present Illness:   Chief Complaint  Patient presents with   Medical Management of Chronic Issues    Pt seen today for labs and discussion about overall not feeling well; extremely fatigued, extremely hard to make it through the day; would like to discuss b12 injections again; would like to speak about puffy face/inflammation; feet have been swelling and bloated x2 weeks, feels like she's holding water;     HPI  Discussed the use of AI scribe software for clinical note transcription with the patient, who gave verbal consent to proceed.  History of Present Illness Penny Duffy is a 43 year old female with hypothyroidism and depression who presents with fatigue, weight management issues, and brain fog.  Fatigue and cognitive dysfunction - Persistent fatigue despite efforts to maintain a healthy lifestyle - Significant brain fog impacting work performance in [employer] and [employer] - Difficulty with focus and memory, leading to challenges in daily tasks - Adderall prescribed for focus issues, but not taken consistently due to concerns about dependency - Physical fatigue and lack of energy diminish overall quality of life - Daytime fatigue present  Weight management difficulties - Difficulty managing weight despite lifestyle modifications - History of gestational diabetes - Previous use of weight loss medications (Wegovy, Saxenda) discontinued due to insurance coverage issues and medication shortages  Sleep disturbance and possible sleep apnea - Heavy snoring observed by children's father - Irregular sleep patterns due to hectic lifestyle - Concern for possible sleep apnea - Poor dietary habits with minimal water intake (less than 20 ounces daily) - Frequent consumption of iced coffee, energy drinks, and diet soda throughout the day  Menstrual irregularities - Menstrual cycles have  become slightly heavier with increased clotting - Shorter duration of menses - Shift in timing of periods from the 5th to the 11th-13th of each month - Tracks menstrual cycles regularly  Hyperthyroidism and vitamin deficiencies - History of hyperthyroidism, taking thyroid  medication consistently - Last blood work in May showed normal thyroid  levels - Slightly low vitamin D  on last blood work - Previously received vitamin B12 injections, she had transitioned to monthly dosing but then she fell off schedule - Concern about current vitamin levels and requests comprehensive evaluation  Depression and body image concerns - Takes Prozac 60 mg, which has improved depression and body image issues - Continued impact of physical fatigue and lack of energy on overall quality of life    Past Medical History:  Diagnosis Date   Graves disease    Hyperthyroidism    Grave's Disease   Medical history non-contributory    Pregnancy induced hypertension    Vaginal delivery    2018, 2020     Social History   Tobacco Use   Smoking status: Former    Types: Cigarettes   Smokeless tobacco: Never  Vaping Use   Vaping status: Never Used  Substance Use Topics   Alcohol use: Not Currently   Drug use: Not Currently    Types: Marijuana    Past Surgical History:  Procedure Laterality Date   EYE SURGERY Right 2020   Orbital decompression   NO PAST SURGERIES     Wisdom Tooth Removal      Family History  Problem Relation Age of Onset   Hypothyroidism Mother    Hypertension Father    Hypertension Brother    Diabetes Maternal Grandmother    Diabetes  Maternal Grandfather    Diabetes Paternal Grandmother    Colon cancer Neg Hx    Breast cancer Neg Hx     No Known Allergies  Current Medications:   Current Outpatient Medications:    FLUoxetine (PROZAC) 20 MG capsule, Take 60 mg by mouth daily., Disp: , Rfl:    lisdexamfetamine (VYVANSE) 10 MG capsule, Take 1 capsule (10 mg total) by mouth  daily., Disp: 30 capsule, Rfl: 0   propylthiouracil  (PTU) 50 MG tablet, Take 50 mg by mouth daily in the afternoon., Disp: , Rfl:    tirzepatide (ZEPBOUND) 2.5 MG/0.5ML Pen, Inject 2.5 mg into the skin once a week., Disp: 2 mL, Rfl: 0   amphetamine-dextroamphetamine (ADDERALL) 30 MG tablet, Take 1 tablet by mouth 2 (two) times daily. (Patient not taking: Reported on 06/24/2024), Disp: , Rfl:    diazepam (VALIUM) 10 MG tablet, Take 10 mg by mouth 2 (two) times daily as needed. (Patient not taking: Reported on 06/24/2024), Disp: , Rfl:   Current Facility-Administered Medications:    lidocaine  (PF) (XYLOCAINE ) 1 % injection 3 mL, 3 mL, Infiltration, Once,    Review of Systems:   Negative unless otherwise specified per HPI.  Vitals:   Vitals:   06/24/24 1354  BP: 122/72  Pulse: 89  Temp: 98.1 F (36.7 C)  TempSrc: Temporal  SpO2: 97%  Weight: 180 lb 3.2 oz (81.7 kg)  Height: 5' 2 (1.575 m)     Body mass index is 32.96 kg/m.  Physical Exam:   Physical Exam Vitals and nursing note reviewed.  Constitutional:      General: She is not in acute distress.    Appearance: She is well-developed. She is not ill-appearing or toxic-appearing.  Cardiovascular:     Rate and Rhythm: Normal rate and regular rhythm.     Pulses: Normal pulses.     Heart sounds: Normal heart sounds, S1 normal and S2 normal.  Pulmonary:     Effort: Pulmonary effort is normal.     Breath sounds: Normal breath sounds.  Skin:    General: Skin is warm and dry.  Neurological:     Mental Status: She is alert.     GCS: GCS eye subscore is 4. GCS verbal subscore is 5. GCS motor subscore is 6.  Psychiatric:        Speech: Speech normal.        Behavior: Behavior normal. Behavior is cooperative.     Assessment and Plan:   Assessment and Plan Assessment & Plan Obesity with associated fatigue and consideration of pharmacologic therapy Obesity with fatigue. Previous medications not covered by insurance. Vyvanse  considered for ADHD and appetite control. Discussed Zepbound for weight loss with self-pay options and potential coverage if sleep apnea confirmed. - Send prescription for Zepbound to pharmacy to check insurance coverage. - Prescribe Vyvanse 10 mg daily in the morning. Instructed to not start Adderall.  - Provide information on self-pay options for Zepbound and Wegovy. - Encourage drinking a full glass of water before caffeinated beverages.  Suspected obstructive sleep apnea Suspected due to snoring and fatigue. Discussed impact on fatigue and insurance coverage for Zepbound if confirmed. - Refer to pulmonology for sleep study evaluation.  Vitamin D  and B12 deficiencies Vitamin D  slightly low, B12 levels not recently checked. Previous B12 injections effective until adherence decreased. - Order blood work to recheck vitamin D  and B12 levels.  Heavy menstrual bleeding Reports heavier bleeding with clots. Discussed potential iron deficiency due to increased flow. -  Consider checking iron levels if symptoms suggest deficiency.  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 Previously on Adderall but inconsistent use. Vyvanse discussed as longer-acting alternative for focus and appetite control. - Prescribe Vyvanse 10 mg daily in the morning. - Discuss potential switch to Vyvanse with Dr Vincente  Major depressive disorder, stable on current therapy Major depressive disorder stable on Prozac. Reports improvement in symptoms and body image.     Lucie Buttner, PA-C

## 2024-06-24 NOTE — Patient Instructions (Signed)
 It was great to see you!  Blood work today Obstructive sleep apnea evaluation Start Vyvanse 10 mg  I will try to send in Zepbound through your insurance Drink water  Let's follow-up in 1 month, sooner if you have concerns.  Take care,  Lucie Buttner PA-C

## 2024-06-25 LAB — CBC WITH DIFFERENTIAL/PLATELET
Absolute Lymphocytes: 1404 {cells}/uL (ref 850–3900)
Absolute Monocytes: 558 {cells}/uL (ref 200–950)
Basophils Absolute: 37 {cells}/uL (ref 0–200)
Basophils Relative: 0.4 %
Eosinophils Absolute: 112 {cells}/uL (ref 15–500)
Eosinophils Relative: 1.2 %
HCT: 43.3 % (ref 35.0–45.0)
Hemoglobin: 14.2 g/dL (ref 11.7–15.5)
MCH: 29 pg (ref 27.0–33.0)
MCHC: 32.8 g/dL (ref 32.0–36.0)
MCV: 88.4 fL (ref 80.0–100.0)
MPV: 12.2 fL (ref 7.5–12.5)
Monocytes Relative: 6 %
Neutro Abs: 7189 {cells}/uL (ref 1500–7800)
Neutrophils Relative %: 77.3 %
Platelets: 272 Thousand/uL (ref 140–400)
RBC: 4.9 Million/uL (ref 3.80–5.10)
RDW: 13 % (ref 11.0–15.0)
Total Lymphocyte: 15.1 %
WBC: 9.3 Thousand/uL (ref 3.8–10.8)

## 2024-06-25 LAB — COMPREHENSIVE METABOLIC PANEL WITH GFR
AG Ratio: 1.8 (calc) (ref 1.0–2.5)
ALT: 16 U/L (ref 6–29)
AST: 15 U/L (ref 10–30)
Albumin: 4.7 g/dL (ref 3.6–5.1)
Alkaline phosphatase (APISO): 65 U/L (ref 31–125)
BUN: 11 mg/dL (ref 7–25)
CO2: 24 mmol/L (ref 20–32)
Calcium: 9.7 mg/dL (ref 8.6–10.2)
Chloride: 101 mmol/L (ref 98–110)
Creat: 0.75 mg/dL (ref 0.50–0.99)
Globulin: 2.6 g/dL (ref 1.9–3.7)
Glucose, Bld: 90 mg/dL (ref 65–99)
Potassium: 4.2 mmol/L (ref 3.5–5.3)
Sodium: 136 mmol/L (ref 135–146)
Total Bilirubin: 0.6 mg/dL (ref 0.2–1.2)
Total Protein: 7.3 g/dL (ref 6.1–8.1)
eGFR: 101 mL/min/1.73m2 (ref >=60)

## 2024-06-25 LAB — IRON,TIBC AND FERRITIN PANEL
%SAT: 20 % (ref 16–45)
Ferritin: 51 ng/mL (ref 16–232)
Iron: 67 ug/dL (ref 40–190)
TIBC: 330 ug/dL (ref 250–450)

## 2024-06-25 LAB — VITAMIN B12: Vitamin B-12: 435 pg/mL (ref 200–1100)

## 2024-06-25 LAB — HEMOGLOBIN A1C
Hgb A1c MFr Bld: 5.1 % (ref <5.7)
Mean Plasma Glucose: 100 mg/dL
eAG (mmol/L): 5.5 mmol/L

## 2024-06-25 LAB — TSH: TSH: 0.58 m[IU]/L

## 2024-06-25 LAB — VITAMIN D 25 HYDROXY (VIT D DEFICIENCY, FRACTURES): Vit D, 25-Hydroxy: 36 ng/mL (ref 30–100)

## 2024-06-27 ENCOUNTER — Ambulatory Visit: Payer: Self-pay | Admitting: Physician Assistant

## 2024-06-27 ENCOUNTER — Other Ambulatory Visit: Payer: Self-pay | Admitting: *Deleted

## 2024-06-27 NOTE — Telephone Encounter (Signed)
 Please resend Rx's to new pharmacy [facility].

## 2024-06-27 NOTE — Telephone Encounter (Signed)
 Copied from CRM [id number]. Topic: Clinical - Prescription Issue >> Jun 27, 2024 10:02 AM Chiquita SQUIBB wrote: Reason for CRM: Patient is calling in due to her insurance not working with [facility] anymore patient would like her medications from 08/1 sent to [facility] and all future medications sent there as well. It was the lisdexamfetamine (VYVANSE ) 10 MG capsule [[id number]] and  tirzepatide  (ZEPBOUND ) 2.5 MG/0.5ML Pen [[id number]], That were originally sent to [facility] to 08/01. Please advise patient of any questions.

## 2024-06-28 ENCOUNTER — Telehealth: Payer: Self-pay

## 2024-06-28 ENCOUNTER — Other Ambulatory Visit (HOSPITAL_COMMUNITY): Payer: Self-pay

## 2024-06-28 ENCOUNTER — Telehealth: Payer: Self-pay | Admitting: *Deleted

## 2024-06-28 MED ORDER — ZEPBOUND 2.5 MG/0.5ML ~~LOC~~ SOAJ: 2.5000 mg | SUBCUTANEOUS | 0 refills | Status: DC

## 2024-06-28 MED ORDER — LISDEXAMFETAMINE DIMESYLATE 10 MG PO CAPS: 10.0000 mg | ORAL_CAPSULE | Freq: Every day | ORAL | 0 refills | Status: DC

## 2024-06-28 MED ORDER — TIRZEPATIDE-WEIGHT MANAGEMENT 2.5 MG/0.5ML ~~LOC~~ SOLN: 2.5000 mg | SUBCUTANEOUS | 0 refills | Status: DC

## 2024-06-28 NOTE — Telephone Encounter (Signed)
 Noted

## 2024-06-28 NOTE — Telephone Encounter (Signed)
 Spoke to pt told her heard back from PA Team and Zepbound  is not covered by your plan. Pt verbalized understanding and said she would like to go ahead and start Zepbound  thru [facility] and pay out of pocket. Told her I will send Rx to them and they will contact you for information. Pt verbalized understanding. Rx for Zepbound  2.5 mg vial sent to [facility].

## 2024-06-28 NOTE — Telephone Encounter (Signed)
Please do PA for Zepbound 2.5 mg.

## 2024-06-28 NOTE — Telephone Encounter (Signed)
 Copied from CRM #[id number]. Topic: Clinical - Medical Advice >> Jun 28, 2024 11:48 AM Penny Duffy wrote: Reason for CRM: Patient is calling in because her insurance is not paying for the zepbound  and is asking for to much for oop at [facility]. She is wanting to know if [facility] can get the prescription for her to pick up. She understands that she will be paying oop and that's fine. If you can'Duffy send it there she is wanting to know where she can get it for the quote of $349.   Please contact her back regarding prescription.

## 2024-06-28 NOTE — Telephone Encounter (Signed)
 Pharmacy Patient Advocate Encounter   Received notification from Physician's Office that prior authorization for Zepbound  2.5MG /0.5ML pen-injectorsis required/requested.   Insurance verification completed.   The patient is insured through [employer] .   Per insurance:    Key: [id number]

## 2024-06-28 NOTE — Addendum Note (Signed)
 Addended by: THURMON ARLAND PARAS on: 06/28/2024 02:35 PM   Modules accepted: Orders

## 2024-07-28 ENCOUNTER — Encounter: Payer: Self-pay | Admitting: Physician Assistant

## 2024-07-28 ENCOUNTER — Ambulatory Visit: Admitting: Physician Assistant

## 2024-07-28 VITALS — BP 122/84 | HR 73 | Temp 98.0°F | Ht 62.0 in | Wt 171.4 lb

## 2024-07-28 DIAGNOSIS — R29818 Other symptoms and signs involving the nervous system: Secondary | ICD-10-CM | POA: Diagnosis not present

## 2024-07-28 DIAGNOSIS — E669 Obesity, unspecified: Secondary | ICD-10-CM

## 2024-07-28 DIAGNOSIS — F329 Major depressive disorder, single episode, unspecified: Secondary | ICD-10-CM | POA: Diagnosis not present

## 2024-07-28 DIAGNOSIS — F902 Attention-deficit hyperactivity disorder, combined type: Secondary | ICD-10-CM | POA: Diagnosis not present

## 2024-07-28 MED ORDER — LISDEXAMFETAMINE DIMESYLATE 10 MG PO CAPS: 10.0000 mg | ORAL_CAPSULE | Freq: Every day | ORAL | 0 refills | Status: DC

## 2024-07-28 MED ORDER — TIRZEPATIDE-WEIGHT MANAGEMENT 2.5 MG/0.5ML ~~LOC~~ SOLN: 2.5000 mg | SUBCUTANEOUS | 0 refills | Status: DC

## 2024-07-28 NOTE — Progress Notes (Signed)
 Penny Duffy is a 43 y.o. female here for a follow up of a pre-existing problem.  History of Present Illness:   Chief Complaint  Patient presents with   Follow-up    Patient states new medication change has been going well. No other concerns to discuss.     Discussed the use of AI scribe software for clinical note transcription with the patient, who gave verbal consent to proceed.  History of Present Illness Penny Duffy is a 43 year old female who presents for follow-up regarding her ADHD and weight management treatment.  She is taking Vyvanse  10 mg for ADHD, which has improved focus and energy without causing shakiness or insomnia compared to prior adderall. She is adjusting the timing of her dose to minimize sleep disruption. She is on Prozac 40 mg daily and is considering reducing it to 20 mg to evaluate its impact on her mental health.  She has lost 9 pounds in the past month, attributing this to her medication regimen. She experiences mild nausea with Zepbound , similar to pregnancy-related nausea, but it is manageable. She is hesitant to increase the dose due to cost and side effects.  She reports improved sleep quality and is optimizing her medication schedule. She has reduced cravings for sweets and coffee, increased water intake, and decreased energy drink consumption. No insomnia since starting Vyvanse .  She has a pulmonary appointment next week to evaluate for sleep apnea.  Wt Readings from Last 3 Encounters:  07/28/24 171 lb 6.4 oz (77.7 kg)  06/24/24 180 lb 3.2 oz (81.7 kg)  01/15/24 174 lb (78.9 kg)     Past Medical History:  Diagnosis Date   Graves disease    Hyperthyroidism    Grave's Disease   Medical history non-contributory    Pregnancy induced hypertension    Vaginal delivery    2018, 2020     Social History   Tobacco Use   Smoking status: Former    Types: Cigarettes   Smokeless tobacco: Never  Vaping Use   Vaping status: Never Used  Substance  Use Topics   Alcohol use: Not Currently   Drug use: Not Currently    Types: Marijuana    Past Surgical History:  Procedure Laterality Date   EYE SURGERY Right 2020   Orbital decompression   NO PAST SURGERIES     Wisdom Tooth Removal      Family History  Problem Relation Age of Onset   Hypothyroidism Mother    Hypertension Father    Hypertension Brother    Diabetes Maternal Grandmother    Diabetes Maternal Grandfather    Diabetes Paternal Grandmother    Colon cancer Neg Hx    Breast cancer Neg Hx     No Known Allergies  Current Medications:   Current Outpatient Medications:    FLUoxetine (PROZAC) 20 MG capsule, Take 60 mg by mouth daily., Disp: , Rfl:    lisdexamfetamine (VYVANSE ) 10 MG capsule, Take 1 capsule (10 mg total) by mouth daily., Disp: 30 capsule, Rfl: 0   propylthiouracil  (PTU) 50 MG tablet, Take 50 mg by mouth daily in the afternoon., Disp: , Rfl:    tirzepatide  (ZEPBOUND ) 2.5 MG/0.5ML injection vial, Inject 2.5 mg into the skin once a week., Disp: 2 mL, Rfl: 0   amphetamine-dextroamphetamine (ADDERALL) 30 MG tablet, Take 1 tablet by mouth 2 (two) times daily. (Patient not taking: Reported on 06/24/2024), Disp: , Rfl:    diazepam (VALIUM) 10 MG tablet, Take 10 mg by mouth 2 (  two) times daily as needed. (Patient not taking: Reported on 06/24/2024), Disp: , Rfl:   Current Facility-Administered Medications:    lidocaine  (PF) (XYLOCAINE ) 1 % injection 3 mL, 3 mL, Infiltration, Once,    Review of Systems:   Negative unless otherwise specified per HPI.  Vitals:   Vitals:   07/28/24 0841  BP: 122/84  Pulse: 73  Temp: 98 F (36.7 C)  TempSrc: Oral  SpO2: 96%  Weight: 171 lb 6.4 oz (77.7 kg)  Height: 5' 2 (1.575 m)     Body mass index is 31.35 kg/m.  Physical Exam:   Physical Exam Vitals and nursing note reviewed.  Constitutional:      General: She is not in acute distress.    Appearance: She is well-developed. She is not ill-appearing or  toxic-appearing.  Cardiovascular:     Rate and Rhythm: Normal rate and regular rhythm.     Pulses: Normal pulses.     Heart sounds: Normal heart sounds, S1 normal and S2 normal.  Pulmonary:     Effort: Pulmonary effort is normal.     Breath sounds: Normal breath sounds.  Skin:    General: Skin is warm and dry.  Neurological:     Mental Status: She is alert.     GCS: GCS eye subscore is 4. GCS verbal subscore is 5. GCS motor subscore is 6.  Psychiatric:        Speech: Speech normal.        Behavior: Behavior normal. Behavior is cooperative.     Assessment and Plan:   Assessment and Plan Assessment & Plan Obesity  She lost 9 pounds in a month on Tirzepatide  2.5 mg weekly. Mild nausea noted but dose increase not warranted. Satisfied with current dose due to cost and side effects. - Continue Tirzepatide  2.5 mg subcutaneously weekly. - Monitor weight and symptoms monthly. - Reassess dose adjustment in one month.  Attention-deficit hyperactivity disorder (ADHD) Improved focus and energy on Lisdexamfetamine 10 mg daily without adverse effects. No insomnia reported. - Continue Lisdexamfetamine 10 mg oral daily. - Advise taking in the morning within two hours of waking. - Follow up in three months for ADHD management.  Reactive depression Considering reducing Fluoxetine from 40 mg to 20 mg daily due to improved symptoms with Vyvanse . Some symptoms may have been related to untreated ADHD. - Trial reduction of Fluoxetine to 20 mg oral daily. - Reassess need for further adjustments in three months.  Suspected sleep apnea Improved sleep quality reported. Scheduled for sleep study to evaluate for sleep apnea. - Proceed with scheduled sleep study for sleep apnea evaluation.  Follow-Up Advised follow-up in three months for ADHD, depression, and weight management. Coordination with pulmonary specialist for sleep study results planned. - Schedule follow-up appointment in three months. -  Coordinate with pulmonary specialist for sleep study results.     Lucie Buttner, PA-C

## 2024-08-01 NOTE — Progress Notes (Deleted)
 08/02/24- 59 yoF referred courtesy of Lucie Buttner, [place] with suspected sleep apnea Medical problems include Hyperthyroid, hx Gestational HTN,  Epworth score- Body weight today-

## 2024-08-02 ENCOUNTER — Ambulatory Visit: Admitting: Internal Medicine

## 2024-08-18 ENCOUNTER — Ambulatory Visit

## 2024-08-18 VITALS — BP 134/84 | HR 85 | Ht 62.0 in | Wt 169.4 lb

## 2024-08-18 DIAGNOSIS — E66811 Obesity, class 1: Secondary | ICD-10-CM

## 2024-08-18 DIAGNOSIS — E0500 Thyrotoxicosis with diffuse goiter without thyrotoxic crisis or storm: Secondary | ICD-10-CM | POA: Diagnosis not present

## 2024-08-18 DIAGNOSIS — G4733 Obstructive sleep apnea (adult) (pediatric): Secondary | ICD-10-CM

## 2024-08-18 DIAGNOSIS — Z6830 Body mass index (BMI) 30.0-30.9, adult: Secondary | ICD-10-CM | POA: Diagnosis not present

## 2024-08-18 NOTE — Patient Instructions (Signed)
 Notification of test results are managed in the following manner: If there are any recommendations or changes to the plan of care discussed in office today, we will contact you and let you know what they are. If you do not hear from us , then your results are normal/expected and you can view them through your MyChart account, or a letter will be sent to you. Thank you again for trusting us  with your care [facility].

## 2024-08-18 NOTE — Progress Notes (Signed)
 New Patient Pulmonology Office Visit   Subjective:  Patient ID: Penny Duffy, female    DOB: 01/31/1981  MRN: [id number]  Referred by: Job Lukes, [place]  CC:  Chief Complaint  Patient presents with   Consult    Never had a sleep study done Pt states she doesn't feel that she gets a good night's rest, and wakes up tired. Also has trouble falling asleep depending on the night and is up and down throughout the night for 3 days out of the week.     HPI Penny Duffy is a 43 y.o. female with hyperthyroidism, gestational hypertension, ADHD on Vyvanse , depression (now off prozac) presents for evaluation of OSA.  No bed partner.   Mouth breather: unsure.  Preferred sleeping position: side.   Sleep related Symptoms:  Snoring- y Witnessed apnea- unsure Gasping/choking- n morning HA/dry mouth- n/y tired on awakening, excessive daytime sleepiness- throughout the day. Recently started on vyvanse .  Restless legs- sometimes but does not bother her.    Sleep routine:  -Bed: 8.30-9.30p usually quick. Unless she naps with kids around 8.30 p.  -Nocturnal awakenings: 2-3 times. Falls back quickly.  -Wake: 6 am. -Napping:no -sleep hygiene: usually will be on computer doing work before bed or packing lunch for kids.   Social Hist/Habits:  -Caffeine: in morning.  -Alcohol: no -Nicotine:quit 20 years smoked socially.  -Occupation: works in Art gallery manager in Teacher, music.    PRIOR TESTS and IMAGING: PSG/HSAT: none.       08/18/2024    3:00 PM  Results of the Epworth flowsheet  Sitting and reading 2  Watching TV 1  Sitting, inactive in a public place (e.g. a theatre or a meeting) 0  As a passenger in a car for an hour without a break 1  Lying down to rest in the afternoon when circumstances permit 1  Sitting and talking to someone 0  Sitting quietly after a lunch without alcohol 0  In a car, while stopped for a few minutes in traffic 0  Total score 5    Allergies:  Patient has no known allergies.  Current Outpatient Medications:    lisdexamfetamine (VYVANSE ) 10 MG capsule, Take 1 capsule (10 mg total) by mouth daily before breakfast., Disp: 30 capsule, Rfl: 0   propylthiouracil  (PTU) 50 MG tablet, Take 50 mg by mouth daily in the afternoon., Disp: , Rfl:    tirzepatide  (ZEPBOUND ) 2.5 MG/0.5ML injection vial, Inject 2.5 mg into the skin once a week., Disp: 2 mL, Rfl: 0   [START ON 08/27/2024] lisdexamfetamine (VYVANSE ) 10 MG capsule, Take 1 capsule (10 mg total) by mouth daily before breakfast. (Patient not taking: Reported on 08/18/2024), Disp: 30 capsule, Rfl: 0   [START ON 09/26/2024] lisdexamfetamine (VYVANSE ) 10 MG capsule, Take 1 capsule (10 mg total) by mouth daily before breakfast. (Patient not taking: Reported on 08/18/2024), Disp: 30 capsule, Rfl: 0  Current Facility-Administered Medications:    lidocaine  (PF) (XYLOCAINE ) 1 % injection 3 mL, 3 mL, Infiltration, Once,  Past Medical History:  Diagnosis Date   Graves disease    Hyperthyroidism    Grave's Disease   Medical history non-contributory    Pregnancy induced hypertension    Vaginal delivery    2018, 2020   Past Surgical History:  Procedure Laterality Date   EYE SURGERY Right 2020   Orbital decompression   NO PAST SURGERIES     Wisdom Tooth Removal     Family History  Problem Relation Age of Onset  Hypothyroidism Mother    Hypertension Father    Hypertension Brother    Diabetes Maternal Grandmother    Diabetes Maternal Grandfather    Diabetes Paternal Grandmother    Colon cancer Neg Hx    Breast cancer Neg Hx    Social History   Socioeconomic History   Marital status: Single    Spouse name: Not on file   Number of children: 2   Years of education: Not on file   Highest education level: Not on file  Occupational History   Not on file  Tobacco Use   Smoking status: Former    Types: Cigarettes   Smokeless tobacco: Never   Tobacco comments:    Smoked socially for  less than one year.  Vaping Use   Vaping status: Never Used  Substance and Sexual Activity   Alcohol use: Not Currently   Drug use: Not Currently    Types: Marijuana   Sexual activity: Yes    Partners: Male    Birth control/protection: None    Comment: menarche 43yo, sexual debut 43yo  Other Topics Concern   Not on file  Social History Narrative   Work in Haematologist   83 and 44 year old - boy and a girl   Social Drivers of Corporate investment banker Strain: Low Risk  (03/08/2019)   Overall Financial Resource Strain (CARDIA)    Difficulty of Paying Living Expenses: Not hard at all  Food Insecurity: No Food Insecurity (03/08/2019)   Hunger Vital Sign    Worried About Running Out of Food in the Last Year: Never true    Ran Out of Food in the Last Year: Never true  Transportation Needs: Unknown (03/08/2019)   PRAPARE - Administrator, Civil Service (Medical): No    Lack of Transportation (Non-Medical): Not on file  Physical Activity: Not on file  Stress: No Stress Concern Present (03/08/2019)   [employer] of Occupational Health - Occupational Stress Questionnaire    Feeling of Stress : Only a little  Social Connections: Not on file  Intimate Partner Violence: Not At Risk (03/08/2019)   Humiliation, Afraid, Rape, and Kick questionnaire    Fear of Current or Ex-Partner: No    Emotionally Abused: No    Physically Abused: No    Sexually Abused: No       Objective:  BP 134/84   Pulse 85   Ht 5' 2 (1.575 m)   Wt 169 lb 6.4 oz (76.8 kg)   SpO2 100% Comment: RA  BMI 30.98 kg/m  BMI Readings from Last 3 Encounters:  08/18/24 30.98 kg/m  07/28/24 31.35 kg/m  06/24/24 32.96 kg/m    Physical Exam: CONSTITUTIONAL: NAD, well-appearing NASAL/OROPHARYNX:  Normal mucosa. No septal deviation. No hypertrophy of inferior turbinates. Modified Mallampati score 3. Tonsillar grade 2.  CV: RRR s1s2 nl, no murmurs  RESP: Clear to auscultation, normal  respiratory effort   NEURO: CN II/XII grossly intact PSYCH: Alert & oriented x 3, Euthymic, appropriate affect  Diagnostic Review:  Last metabolic panel Lab Results  Component Value Date   GLUCOSE 90 06/24/2024   NA 136 06/24/2024   K 4.2 06/24/2024   CL 101 06/24/2024   CO2 24 06/24/2024   BUN 11 06/24/2024   CREATININE 0.75 06/24/2024   EGFR 101 06/24/2024   CALCIUM 9.7 06/24/2024   PROT 7.3 06/24/2024   ALBUMIN 4.5 02/12/2023   LABGLOB 2.5 09/16/2018   AGRATIO 1.7 09/16/2018  BILITOT 0.6 06/24/2024   ALKPHOS 76 02/12/2023   AST 15 06/24/2024   ALT 16 06/24/2024   ANIONGAP 9 03/09/2019         Assessment & Plan:   Assessment & Plan OSA (obstructive sleep apnea) I discussed with the patient the pathophysiology of obstructive sleep apnea, its association with weight, and its negative effects on hypertension, diabetes, mental health, A-fib, stroke if left untreated.  I briefly discussed the treatment options for obstructive sleep apnea If neg will get in lab PSG.  Orders:   Home sleep test; Future  Obesity, class 1 On tirzepatide  paying out of pocket.      Graves disease On PTU.       He/She was counselled about not driving while drowsy which is common side effect of sleep related disorders.   No follow-ups on file.   Aliviah Spain, MD

## 2024-09-16 ENCOUNTER — Telehealth: Payer: Self-pay | Admitting: Physician Assistant

## 2024-09-16 MED ORDER — TIRZEPATIDE-WEIGHT MANAGEMENT 5 MG/0.5ML ~~LOC~~ SOLN: 5.0000 mg | SUBCUTANEOUS | 0 refills | Status: DC

## 2024-09-16 NOTE — Telephone Encounter (Signed)
 Copied from CRM [id number]. Topic: Clinical - Medication Refill >> Sep 16, 2024 11:10 AM Tanazia G wrote: Medication: tirzepatide  (ZEPBOUND ) 2.5 MG/0.5ML injection vial (PATIENT STATES SHE NEEDS THE NEXT DOSAGE UP)  Has the patient contacted their pharmacy? Yes (Agent: If no, request that the patient contact the pharmacy for the refill. If patient does not wish to contact the pharmacy document the reason why and proceed with request.) (Agent: If yes, when and what did the pharmacy advise?)  This is the patient's preferred pharmacy:  [facility] - [place], [place] - [phone] [place] [address] Phone: [phone] Fax: [phone]  Is this the correct pharmacy for this prescription? Yes If no, delete pharmacy and type the correct one.   Has the prescription been filled recently? Yes  Is the patient out of the medication? Yes  Has the patient been seen for an appointment in the last year OR does the patient have an upcoming appointment? Yes  Can we respond through MyChart? Yes  Agent: Please be advised that Rx refills may take up to 3 business days. We ask that you follow-up with your pharmacy.

## 2024-10-05 ENCOUNTER — Encounter

## 2024-10-05 DIAGNOSIS — G4733 Obstructive sleep apnea (adult) (pediatric): Secondary | ICD-10-CM

## 2024-10-19 ENCOUNTER — Telehealth: Payer: Self-pay

## 2024-10-19 DIAGNOSIS — G4733 Obstructive sleep apnea (adult) (pediatric): Secondary | ICD-10-CM

## 2024-10-19 DIAGNOSIS — G4730 Sleep apnea, unspecified: Secondary | ICD-10-CM | POA: Diagnosis not present

## 2024-10-19 NOTE — Telephone Encounter (Signed)
 Copied from CRM [id number]. Topic: Clinical - Order For Equipment >> Oct 19, 2024 10:51 AM Celestine FALCON wrote: Reason for CRM: Pt called today and I relayed what Dr. Theodoro had stated regarding the SLEEP STUDY.   Date of Study: 10/05/24   Interpretation: Severe OSA with AHI of 37.2/hr, O2 desaturation 21 min. O2 nadir 78%.    Plan: Initiate auto CPAP at 6-15 cm H2O and provide supplies.  Side sleep.    Sammi Theodoro, MD.  Pt in interested in the CPAP and would be open to starting this as soon as possible. She is also requesting this information to be sent to her PA Lucie Buttner to help obtaining info for insurance to approve medication Zepbound .   Pt's phone number is  [phone] ok to leave a vm.

## 2024-10-19 NOTE — Telephone Encounter (Signed)
 ATC X1. LMTCB

## 2024-10-19 NOTE — Telephone Encounter (Signed)
 Order for CPAP was placed per conversation with E2C2  Not sure what info PCP needs but they are in Epic so should be able to view our records  Called pt and there was no answer- LMTCB

## 2024-10-19 NOTE — Telephone Encounter (Signed)
 Date of Study: 10/05/24  Interpretation: Severe OSA with AHI of 37.2/hr, O2 desaturation 21 min. O2 nadir 78%.   Plan: Initiate auto CPAP at 6-15 cm H2O and provide supplies.  Side sleep.   Sammi Fredericks, MD.

## 2024-10-19 NOTE — Addendum Note (Signed)
 Addended by: Coree Riester M on: 10/19/2024 02:55 PM   Modules accepted: Orders

## 2024-10-24 ENCOUNTER — Telehealth: Payer: Self-pay

## 2024-10-24 MED ORDER — ZEPBOUND 2.5 MG/0.5ML ~~LOC~~ SOAJ: 2.5000 mg | SUBCUTANEOUS | 0 refills | Status: DC

## 2024-10-24 NOTE — Addendum Note (Signed)
 Addended by: Oreta Soloway J on: 10/24/2024 08:36 PM   Modules accepted: Orders

## 2024-10-24 NOTE — Telephone Encounter (Signed)
 Please Review  Copied from CRM #[id number]. Topic: General - Other >> Oct 24, 2024  9:38 AM Sophia H wrote: Reason for CRM: Patient states she would like the provider to review notes in chart from Theodoro [place], MD regarding her recent sleep study. States it did show severe sleep apnea and she is wanting to know if provider can use those notes to resubmit authorization for zepbound . Please advise # [id number]   Also confirmed next appt :  Nov 03 2024 08:40 AM - Office Visit [facility] [address] - [place], [place]

## 2024-10-25 ENCOUNTER — Telehealth: Payer: Self-pay | Admitting: *Deleted

## 2024-10-25 ENCOUNTER — Other Ambulatory Visit (HOSPITAL_COMMUNITY): Payer: Self-pay

## 2024-10-25 NOTE — Telephone Encounter (Signed)
 Penny Duffy, was the PA in August done for Obstructive Sleep Apnea?

## 2024-10-25 NOTE — Telephone Encounter (Signed)
Message sent to PA Team

## 2024-10-25 NOTE — Telephone Encounter (Signed)
 Please do PA for Zepbound  2.5 mg under obstructive sleep apnea.

## 2024-10-27 ENCOUNTER — Ambulatory Visit: Admitting: Physician Assistant

## 2024-11-03 ENCOUNTER — Encounter: Payer: Self-pay | Admitting: Physician Assistant

## 2024-11-03 ENCOUNTER — Ambulatory Visit: Admitting: Physician Assistant

## 2024-11-03 VITALS — BP 144/90 | HR 80 | Temp 98.1°F | Ht 62.0 in | Wt 172.4 lb

## 2024-11-03 DIAGNOSIS — E669 Obesity, unspecified: Secondary | ICD-10-CM

## 2024-11-03 DIAGNOSIS — E559 Vitamin D deficiency, unspecified: Secondary | ICD-10-CM

## 2024-11-03 DIAGNOSIS — F902 Attention-deficit hyperactivity disorder, combined type: Secondary | ICD-10-CM | POA: Diagnosis not present

## 2024-11-03 DIAGNOSIS — Z1231 Encounter for screening mammogram for malignant neoplasm of breast: Secondary | ICD-10-CM

## 2024-11-03 DIAGNOSIS — R030 Elevated blood-pressure reading, without diagnosis of hypertension: Secondary | ICD-10-CM | POA: Diagnosis not present

## 2024-11-03 DIAGNOSIS — E0590 Thyrotoxicosis, unspecified without thyrotoxic crisis or storm: Secondary | ICD-10-CM

## 2024-11-03 DIAGNOSIS — E538 Deficiency of other specified B group vitamins: Secondary | ICD-10-CM | POA: Diagnosis not present

## 2024-11-03 DIAGNOSIS — G4733 Obstructive sleep apnea (adult) (pediatric): Secondary | ICD-10-CM | POA: Diagnosis not present

## 2024-11-03 NOTE — Progress Notes (Signed)
 History of Present Illness:   Chief Complaint  Patient presents with   ADHD    Pt here for 3 month f/u currently taking Vyvanse  10 mg.   Weight Management Screening    Pt would like to discuss weight loss options.    Discussed the use of AI scribe software for clinical note transcription with the patient, who gave verbal consent to proceed.  History of Present Illness   Penny Duffy is a 43 year old female with severe sleep apnea who presents for follow-up on her sleep apnea management and medication concerns.  She has severe sleep apnea confirmed on recent sleep study and is waiting for CPAP approval through insurance. She has significant fatigue and brain fog that she attributes to poor sleep.  She takes Vyvanse , which improves energy but she still has an afternoon energy drop. She sometimes feels jittery and is hesitant to increase the dose due to past issues with Adderall. She prefers to keep the current dose and reassess after CPAP use.  She used Zepbound  for weight management with good initial weight loss but stopped after insurance stopped covering it. She is considering alternatives, including Wegovy, which she has used before but discontinued due to coverage and access issues.  She has thyroid  disease treated with PTU 50 mg daily, which she feels controls her symptoms. She is cautious about overmedication because of prior Graves disease with eye involvement. She was discharged from her previous endocrinologist due to missed appointments and is seeking a new endocrinologist, with a 90-day supply of PTU remaining.  She notes feeling puffy and retaining fluid, which she relates to her menstrual cycle. She is caring for two young children, ages 40 and 44.        Past Medical History:  Diagnosis Date   Graves disease    Hyperthyroidism    Grave's Disease   Medical history non-contributory    Pregnancy induced hypertension    Vaginal delivery    2018, 2020      Social History[1]  Past Surgical History:  Procedure Laterality Date   EYE SURGERY Right 2020   Orbital decompression   NO PAST SURGERIES     Wisdom Tooth Removal      Family History  Problem Relation Age of Onset   Hypothyroidism Mother    Hypertension Father    Hypertension Brother    Diabetes Maternal Grandmother    Diabetes Maternal Grandfather    Diabetes Paternal Grandmother    Colon cancer Neg Hx    Breast cancer Neg Hx     Allergies[2]  Current Medications:  Current Medications[3]   Review of Systems:   Negative unless otherwise specified per HPI.  Vitals:   Vitals:   11/03/24 0847  BP: (!) 144/90  Pulse: 80  Temp: 98.1 F (36.7 C)  TempSrc: Temporal  SpO2: 99%  Weight: 172 lb 6.1 oz (78.2 kg)  Height: 5' 2 (1.575 m)     Body mass index is 31.53 kg/m.  Physical Exam:   Physical Exam Vitals and nursing note reviewed.  Constitutional:      General: She is not in acute distress.    Appearance: She is well-developed. She is not ill-appearing or toxic-appearing.  Neck:     Thyroid : No thyroid  mass or thyromegaly.  Cardiovascular:     Rate and Rhythm: Normal rate and regular rhythm.     Pulses: Normal pulses.     Heart sounds: Normal heart sounds, S1 normal and S2 normal.  Pulmonary:     Effort: Pulmonary effort is normal.     Breath sounds: Normal breath sounds.  Musculoskeletal:     Cervical back: Full passive range of motion without pain.  Lymphadenopathy:     Cervical: No cervical adenopathy.  Skin:    General: Skin is warm and dry.  Neurological:     Mental Status: She is alert.     GCS: GCS eye subscore is 4. GCS verbal subscore is 5. GCS motor subscore is 6.  Psychiatric:        Speech: Speech normal.        Behavior: Behavior normal. Behavior is cooperative.     Assessment and Plan:   Assessment and Plan    Severe obstructive sleep apnea Confirmed by recent sleep study. Awaiting CPAP machine delivery. - Await CPAP  machine delivery and initiate therapy.  Obesity Management complicated by insurance coverage issues for weight loss medications. Georjean considered cost-effective with recent price reductions. Oral options anticipated next month. - Initiated Wegovy at 0.5 mg dose. - Monitor response to [facility] and adjust dosage as needed. - Consider oral weight loss medication options when available.  Attention-deficit hyperactivity disorder, combined type Managed with Vyvanse , improving energy levels. Occasional jitteriness noted. - Continue current Vyvanse  dosage.  Hyperthyroidism Managed with PTU. Concerns about dosage due to past adverse effects. Current prescription is 50 mg daily, taken once daily. Thyroid  function to be assessed. - Ordered thyroid  function tests. - Referred to [facility] endocrinology for further management. - Continue current PTU dosage until further evaluation.  B12 deficiency Update today and provide recommendations   Vitamin D  deficiency Update vitamin D  and provide recommendations   Elevated blood pressure reading Above goal today No evidence of end-organ damage on my exam Recommend patient monitor home blood pressure at least a few times weekly If home monitoring shows consistent elevation, or any symptom(s) develop, recommend reach out to us  for further advice on next steps  Breast cancer screening by mammogram Ordered mammogram today    Lucie Buttner, PA-C    [1]  Social History Tobacco Use   Smoking status: Former    Types: Cigarettes   Smokeless tobacco: Never   Tobacco comments:    Smoked socially for less than one year.  Vaping Use   Vaping status: Never Used  Substance Use Topics   Alcohol use: Not Currently   Drug use: Not Currently    Types: Marijuana  [2] No Known Allergies [3]  Current Outpatient Medications:    lisdexamfetamine (VYVANSE ) 10 MG capsule, Take 1 capsule (10 mg total) by mouth daily before breakfast., Disp: 30 capsule, Rfl: 0    lisdexamfetamine (VYVANSE ) 10 MG capsule, Take 1 capsule (10 mg total) by mouth daily before breakfast., Disp: 30 capsule, Rfl: 0   lisdexamfetamine (VYVANSE ) 10 MG capsule, Take 1 capsule (10 mg total) by mouth daily before breakfast., Disp: 30 capsule, Rfl: 0   propylthiouracil  (PTU) 50 MG tablet, Take 50 mg by mouth daily in the afternoon., Disp: , Rfl:    semaglutide-weight management (WEGOVY) 0.5 MG/0.5ML SOAJ SQ injection, Inject 0.5 mg into the skin once a week., Disp: 2 mL, Rfl: 1  Current Facility-Administered Medications:    lidocaine  (PF) (XYLOCAINE ) 1 % injection 3 mL, 3 mL, Infiltration, Once,

## 2024-11-03 NOTE — Patient Instructions (Signed)
°  VISIT SUMMARY: Today, you had a follow-up visit to discuss your severe sleep apnea, weight management, ADHD, and thyroid  disease. We reviewed your current treatments and made some adjustments to better manage your conditions.  YOUR PLAN: SEVERE OBSTRUCTIVE SLEEP APNEA: You have severe sleep apnea confirmed by a recent sleep study and are waiting for your CPAP machine. -Await CPAP machine delivery and start using it as soon as it arrives.  OBESITY: You have been managing your weight with medications, but insurance coverage has been an issue. -Start Wegovy at a 0.5 mg dose. -Monitor your response to [facility] and adjust the dosage as needed. -Consider oral weight loss medication options when they become available next month.  ATTENTION-DEFICIT HYPERACTIVITY DISORDER (ADHD): Your ADHD is being managed with Vyvanse , which helps with your energy levels, though you sometimes feel jittery. -Continue taking your current dose of Vyvanse .  HYPERTHYROIDISM: Your thyroid  disease is managed with PTU, and you have concerns about overmedication due to past issues. -Continue taking PTU at 50 mg daily. -Get thyroid  function tests done. -Follow up with [facility] endocrinology for further management.  GENERAL HEALTH MAINTENANCE: Routine health maintenance was discussed during your visit. -Your blood pressure was rechecked at the end of the visit. -Get your vitamin levels checked as ordered.                      Contains text generated by Abridge.                                 Contains text generated by Abridge.

## 2024-12-16 ENCOUNTER — Other Ambulatory Visit: Payer: Self-pay | Admitting: Physician Assistant

## 2024-12-16 ENCOUNTER — Telehealth: Payer: Self-pay | Admitting: *Deleted

## 2024-12-16 MED ORDER — LISDEXAMFETAMINE DIMESYLATE 10 MG PO CAPS: 10.0000 mg | ORAL_CAPSULE | Freq: Every day | ORAL | 0 refills | Status: AC

## 2024-12-16 NOTE — Telephone Encounter (Signed)
 Please see message and advise

## 2024-12-16 NOTE — Telephone Encounter (Signed)
 Copied from CRM #[id number]. Topic: Clinical - Medication Refill >> Dec 16, 2024  1:27 PM Macario HERO wrote: Medication:  lisdexamfetamine  (VYVANSE ) 10 MG capsule  Has the patient contacted their pharmacy? No (Agent: If no, request that the patient contact the pharmacy for the refill. If patient does not wish to contact the pharmacy document the reason why and proceed with request.) (Agent: If yes, when and what did the pharmacy advise?)  This is the patient's preferred pharmacy:  [facility] [phone] - [place], [place] - [address] [place] [address] Phone: [phone] Fax: [phone]   Is this the correct pharmacy for this prescription? Yes If no, delete pharmacy and type the correct one.   Has the prescription been filled recently? Yes  Is the patient out of the medication? Yes  Has the patient been seen for an appointment in the last year OR does the patient have an upcoming appointment? Yes  Can we respond through MyChart? Yes  Agent: Please be advised that Rx refills may take up to 3 business days. We ask that you follow-up with your pharmacy.

## 2024-12-16 NOTE — Telephone Encounter (Signed)
 Copied from CRM [id number]. Topic: Clinical - Prescription Issue >> Dec 16, 2024  1:29 PM Macario HERO wrote: Reason for CRM: Patient said she is no longer getting Diezepam from Dr. Vincente and requesting provider to start prescribing it with the same dosage.

## 2024-12-16 NOTE — Telephone Encounter (Signed)
 Pt requesting refill for Vyvanse  10 mg capsule. Last OV 10/2024.

## 2024-12-19 ENCOUNTER — Telehealth: Payer: Self-pay | Admitting: *Deleted

## 2024-12-19 NOTE — Telephone Encounter (Signed)
 Left message on voicemail need to know how often taking medication? Please call back of send message thru My Chart.

## 2024-12-19 NOTE — Telephone Encounter (Signed)
 Copied from CRM [id number]. Topic: Clinical - Prescription Issue >> Dec 16, 2024  1:29 PM Macario HERO wrote: Reason for CRM: Patient said she is no longer getting Diezepam from Dr. Vincente and requesting provider to start prescribing it with the same dosage. >> Dec 19, 2024 10:23 AM Mercedes MATSU wrote: Patient called in stating that she had a missed call from the nurse, asking to verify her dosage for Diazepam  which is 10mg . Patient states that if the nurse has any other questions she can be reached at the number on file.

## 2024-12-19 NOTE — Telephone Encounter (Signed)
 Left detailed message on personal voicemail, called in regards to refill for Diazepam . I need to know dosage and how often taking medication. Please call office or you can answer My Chart message I sent you. Thank you.

## 2024-12-19 NOTE — Telephone Encounter (Signed)
 Penny Duffy, pt is taking Diazepam  10 mg twice a day.

## 2024-12-19 NOTE — Telephone Encounter (Signed)
 See MyChart message.

## 2024-12-21 ENCOUNTER — Other Ambulatory Visit: Payer: Self-pay | Admitting: Physician Assistant

## 2024-12-21 MED ORDER — DIAZEPAM 5 MG PO TABS: 5.0000 mg | ORAL_TABLET | Freq: Every day | ORAL | 1 refills | Status: AC | PRN

## 2025-01-03 ENCOUNTER — Ambulatory Visit

## 2025-02-02 ENCOUNTER — Ambulatory Visit: Admitting: Physician Assistant
